# Patient Record
Sex: Female | Born: 1945
Health system: Southern US, Community
[De-identification: ages and names within clinical notes are randomized; demographics above are authoritative.]

## PROBLEM LIST (undated history)

## (undated) DIAGNOSIS — I10 Essential (primary) hypertension: Secondary | ICD-10-CM

## (undated) DIAGNOSIS — E7800 Pure hypercholesterolemia, unspecified: Secondary | ICD-10-CM

## (undated) DIAGNOSIS — E669 Obesity, unspecified: Secondary | ICD-10-CM

## (undated) DIAGNOSIS — E559 Vitamin D deficiency, unspecified: Secondary | ICD-10-CM

## (undated) DIAGNOSIS — R921 Mammographic calcification found on diagnostic imaging of breast: Secondary | ICD-10-CM

## (undated) DIAGNOSIS — Z860100 Personal history of colon polyps, unspecified: Secondary | ICD-10-CM

## (undated) DIAGNOSIS — G43909 Migraine, unspecified, not intractable, without status migrainosus: Secondary | ICD-10-CM

## (undated) DIAGNOSIS — A64 Unspecified sexually transmitted disease: Secondary | ICD-10-CM

## (undated) DIAGNOSIS — L280 Lichen simplex chronicus: Secondary | ICD-10-CM

## (undated) DIAGNOSIS — D219 Benign neoplasm of connective and other soft tissue, unspecified: Secondary | ICD-10-CM

## (undated) DIAGNOSIS — A630 Anogenital (venereal) warts: Secondary | ICD-10-CM

## (undated) DIAGNOSIS — D649 Anemia, unspecified: Secondary | ICD-10-CM

## (undated) HISTORY — DX: Anemia, unspecified: D64.9

## (undated) HISTORY — PX: MYOMECTOMY: SHX85

## (undated) HISTORY — DX: Obesity, unspecified: E66.9

## (undated) HISTORY — DX: Pure hypercholesterolemia, unspecified: E78.00

## (undated) HISTORY — DX: Anogenital (venereal) warts: A63.0

## (undated) HISTORY — PX: ABDOMINAL HYSTERECTOMY: SHX81

## (undated) HISTORY — DX: Mammographic calcification found on diagnostic imaging of breast: R92.1

## (undated) HISTORY — DX: Unspecified sexually transmitted disease: A64

## (undated) HISTORY — DX: Lichen simplex chronicus: L28.0

## (undated) HISTORY — DX: Migraine, unspecified, not intractable, without status migrainosus: G43.909

## (undated) HISTORY — DX: Personal history of colonic polyps: Z86.010

## (undated) HISTORY — DX: Personal history of colon polyps, unspecified: Z86.0100

## (undated) HISTORY — PX: BREAST SURGERY: SHX581

## (undated) HISTORY — DX: Essential (primary) hypertension: I10

## (undated) HISTORY — DX: Vitamin D deficiency, unspecified: E55.9

## (undated) HISTORY — PX: BREAST BIOPSY: SHX20

## (undated) HISTORY — DX: Benign neoplasm of connective and other soft tissue, unspecified: D21.9

---

## 1998-03-08 ENCOUNTER — Ambulatory Visit (HOSPITAL_COMMUNITY): Admission: RE | Admit: 1998-03-08 | Discharge: 1998-03-08 | Payer: Self-pay

## 1999-03-29 ENCOUNTER — Ambulatory Visit (HOSPITAL_COMMUNITY): Admission: RE | Admit: 1999-03-29 | Discharge: 1999-03-29 | Payer: Self-pay | Admitting: Obstetrics and Gynecology

## 2000-03-29 ENCOUNTER — Ambulatory Visit (HOSPITAL_COMMUNITY): Admission: RE | Admit: 2000-03-29 | Discharge: 2000-03-29 | Payer: Self-pay

## 2001-04-18 ENCOUNTER — Encounter: Payer: Self-pay | Admitting: Obstetrics and Gynecology

## 2001-04-18 ENCOUNTER — Ambulatory Visit (HOSPITAL_COMMUNITY): Admission: RE | Admit: 2001-04-18 | Discharge: 2001-04-18 | Payer: Self-pay | Admitting: Internal Medicine

## 2002-04-20 ENCOUNTER — Ambulatory Visit (HOSPITAL_COMMUNITY): Admission: RE | Admit: 2002-04-20 | Discharge: 2002-04-20 | Payer: Self-pay | Admitting: Obstetrics and Gynecology

## 2002-04-20 ENCOUNTER — Encounter: Payer: Self-pay | Admitting: Obstetrics and Gynecology

## 2002-10-30 ENCOUNTER — Encounter: Payer: Self-pay | Admitting: Obstetrics and Gynecology

## 2002-10-30 ENCOUNTER — Encounter: Admission: RE | Admit: 2002-10-30 | Discharge: 2002-10-30 | Payer: Self-pay | Admitting: Obstetrics and Gynecology

## 2003-11-10 ENCOUNTER — Encounter: Admission: RE | Admit: 2003-11-10 | Discharge: 2003-11-10 | Payer: Self-pay | Admitting: Obstetrics and Gynecology

## 2004-01-14 ENCOUNTER — Ambulatory Visit (HOSPITAL_COMMUNITY): Admission: RE | Admit: 2004-01-14 | Discharge: 2004-01-14 | Payer: Self-pay | Admitting: Gastroenterology

## 2004-01-14 ENCOUNTER — Encounter (INDEPENDENT_AMBULATORY_CARE_PROVIDER_SITE_OTHER): Payer: Self-pay | Admitting: Specialist

## 2004-11-14 ENCOUNTER — Ambulatory Visit (HOSPITAL_COMMUNITY): Admission: RE | Admit: 2004-11-14 | Discharge: 2004-11-14 | Payer: Self-pay | Admitting: Obstetrics and Gynecology

## 2005-11-16 ENCOUNTER — Ambulatory Visit (HOSPITAL_COMMUNITY): Admission: RE | Admit: 2005-11-16 | Discharge: 2005-11-16 | Payer: Self-pay | Admitting: Obstetrics and Gynecology

## 2006-12-09 ENCOUNTER — Ambulatory Visit (HOSPITAL_COMMUNITY): Admission: RE | Admit: 2006-12-09 | Discharge: 2006-12-09 | Payer: Self-pay | Admitting: Obstetrics and Gynecology

## 2006-12-17 ENCOUNTER — Ambulatory Visit (HOSPITAL_COMMUNITY): Admission: RE | Admit: 2006-12-17 | Discharge: 2006-12-17 | Payer: Self-pay | Admitting: Obstetrics & Gynecology

## 2007-11-29 DIAGNOSIS — L280 Lichen simplex chronicus: Secondary | ICD-10-CM

## 2007-11-29 HISTORY — DX: Lichen simplex chronicus: L28.0

## 2007-12-12 ENCOUNTER — Ambulatory Visit (HOSPITAL_COMMUNITY): Admission: RE | Admit: 2007-12-12 | Discharge: 2007-12-12 | Payer: Self-pay | Admitting: Obstetrics & Gynecology

## 2007-12-19 ENCOUNTER — Encounter: Admission: RE | Admit: 2007-12-19 | Discharge: 2007-12-19 | Payer: Self-pay | Admitting: Obstetrics & Gynecology

## 2007-12-19 ENCOUNTER — Encounter (INDEPENDENT_AMBULATORY_CARE_PROVIDER_SITE_OTHER): Payer: Self-pay | Admitting: Diagnostic Radiology

## 2008-05-31 ENCOUNTER — Encounter: Admission: RE | Admit: 2008-05-31 | Discharge: 2008-05-31 | Payer: Self-pay | Admitting: Obstetrics & Gynecology

## 2008-12-17 ENCOUNTER — Encounter: Admission: RE | Admit: 2008-12-17 | Discharge: 2008-12-17 | Payer: Self-pay | Admitting: Obstetrics & Gynecology

## 2009-12-21 ENCOUNTER — Encounter: Admission: RE | Admit: 2009-12-21 | Discharge: 2009-12-21 | Payer: Self-pay | Admitting: Obstetrics & Gynecology

## 2010-09-15 NOTE — Op Note (Signed)
Leslie Webb, Leslie Webb                          ACCOUNT NO.:  [id number]   MEDICAL RECORD NO.:  [id number]                   PATIENT TYPE:  AMB   LOCATION:  ENDO                                 FACILITY:  [facility]   PHYSICIAN:  Anselmo Rod, M.D.               DATE OF BIRTH:  1945/09/04   DATE OF PROCEDURE:  01/14/2004  DATE OF DISCHARGE:                                 OPERATIVE REPORT   PROCEDURE PERFORMED:  Colonoscopy with biopsies times two.   ENDOSCOPIST:  Charna Elizabeth, M.D.   INSTRUMENT USED:  Olympus video colonoscope.   INDICATIONS FOR PROCEDURE:  The patient is a 65 year old African-American  female undergoing screening colonoscopy.  The patient has a history of colon  cancer in both mother and father's side of the family.  Rule out colonic  polyps, masses, etc.   PREPROCEDURE PREPARATION:  Informed consent was procured from the patient.  The patient was fasted for eight hours prior to the procedure and prepped  with a bottle of magnesium citrate and a gallon of GoLYTELY the night prior  to the procedure.   PREPROCEDURE PHYSICAL:  The patient had stable vital signs.  Neck supple.  Chest clear to auscultation.  S1 and S2 regular.  Abdomen soft with normal  bowel sounds.   DESCRIPTION OF PROCEDURE:  The patient was placed in left lateral decubitus  position and sedated with 75 mg of Demerol and 6 mg of Versed in slow  incremental doses.  Once the patient was adequately sedated and maintained  on low flow oxygen and continuous cardiac monitoring, the Olympus video  colonoscope was advanced from the rectum to the cecum with difficulty  because of the patient's body habitus.  The patient's position was changed  from the left lateral to the supine position with gentle application of  abdominal pressure to reach the cecum.  The appendicular orifice and  ileocecal valve were clearly visualized and photographed.  A small sessile  polyp was biopsied from the cecum and a small  sessile polyp was biopsied  from the rectum.  Retroflexion in the rectum revealed no abnormalities.  A  few early scattered diverticula were seen throughout the colon.   IMPRESSION:  1.  Small sessile polyp biopsied from rectum and cecum.  2.  Early scattered diverticulosis throughout the colon.  3.  No large masses or polyps seen.   RECOMMENDATIONS:  1.  Await pathology results.  2.  Repeat colorectal cancer screening depending on pathology results.  3.  Outpatient followup in the next two weeks for further recommendations.      JNM/MEDQ  D:  01/14/2004  T:  01/16/2004  Job:  [id number]   cc:   Laqueta Linden, M.D.  [address]  [place]  [place] [address]  Fax: [phone]   Janae Bridgeman. Eloise Harman., M.D.  [address]  [place]  [place] [address]  Fax: [phone]

## 2010-11-14 ENCOUNTER — Other Ambulatory Visit: Payer: Self-pay | Admitting: Obstetrics & Gynecology

## 2010-11-14 DIAGNOSIS — Z1231 Encounter for screening mammogram for malignant neoplasm of breast: Secondary | ICD-10-CM

## 2010-12-25 ENCOUNTER — Ambulatory Visit (HOSPITAL_COMMUNITY)
Admission: RE | Admit: 2010-12-25 | Discharge: 2010-12-25 | Disposition: A | Discharge status: Home / Self Care | Payer: BC Managed Care – PPO | Source: Ambulatory Visit | Attending: Obstetrics & Gynecology | Admitting: Obstetrics & Gynecology

## 2010-12-25 DIAGNOSIS — Z1231 Encounter for screening mammogram for malignant neoplasm of breast: Secondary | ICD-10-CM

## 2011-03-07 LAB — HM PAP SMEAR: HM Pap smear: NEGATIVE

## 2011-12-05 ENCOUNTER — Other Ambulatory Visit: Payer: Self-pay | Admitting: Obstetrics & Gynecology

## 2011-12-05 DIAGNOSIS — Z1231 Encounter for screening mammogram for malignant neoplasm of breast: Secondary | ICD-10-CM

## 2011-12-27 ENCOUNTER — Ambulatory Visit (HOSPITAL_COMMUNITY)
Admission: RE | Admit: 2011-12-27 | Discharge: 2011-12-27 | Disposition: A | Discharge status: Home / Self Care | Payer: BC Managed Care – PPO | Source: Ambulatory Visit | Attending: Obstetrics & Gynecology | Admitting: Obstetrics & Gynecology

## 2011-12-27 DIAGNOSIS — Z1231 Encounter for screening mammogram for malignant neoplasm of breast: Secondary | ICD-10-CM | POA: Insufficient documentation

## 2012-12-04 ENCOUNTER — Other Ambulatory Visit: Payer: Self-pay | Admitting: Obstetrics & Gynecology

## 2012-12-04 DIAGNOSIS — Z1231 Encounter for screening mammogram for malignant neoplasm of breast: Secondary | ICD-10-CM

## 2013-01-20 ENCOUNTER — Other Ambulatory Visit: Payer: Self-pay | Admitting: Obstetrics & Gynecology

## 2013-01-20 ENCOUNTER — Ambulatory Visit (HOSPITAL_COMMUNITY)
Admission: RE | Admit: 2013-01-20 | Discharge: 2013-01-20 | Disposition: A | Discharge status: Home / Self Care | Payer: BC Managed Care – PPO | Source: Ambulatory Visit | Attending: Obstetrics & Gynecology | Admitting: Obstetrics & Gynecology

## 2013-01-20 DIAGNOSIS — Z1231 Encounter for screening mammogram for malignant neoplasm of breast: Secondary | ICD-10-CM | POA: Insufficient documentation

## 2013-04-03 ENCOUNTER — Ambulatory Visit (INDEPENDENT_AMBULATORY_CARE_PROVIDER_SITE_OTHER): Payer: BC Managed Care – PPO | Admitting: Obstetrics and Gynecology

## 2013-04-03 ENCOUNTER — Encounter: Payer: Self-pay | Admitting: Obstetrics and Gynecology

## 2013-04-03 VITALS — BP 146/90 | HR 64 | Ht 67.0 in | Wt 275.0 lb

## 2013-04-03 DIAGNOSIS — Z01419 Encounter for gynecological examination (general) (routine) without abnormal findings: Secondary | ICD-10-CM

## 2013-04-03 DIAGNOSIS — M25569 Pain in unspecified knee: Secondary | ICD-10-CM

## 2013-04-03 DIAGNOSIS — M25562 Pain in left knee: Secondary | ICD-10-CM

## 2013-04-03 DIAGNOSIS — R159 Full incontinence of feces: Secondary | ICD-10-CM

## 2013-04-03 NOTE — Patient Instructions (Signed)
EXERCISE AND DIET:  We recommended that you start or continue a regular exercise program for good health. Regular exercise means any activity that makes your heart beat faster and makes you sweat.  We recommend exercising at least 30 minutes per day at least 3 days a week, preferably 4 or 5.  We also recommend a diet low in fat and sugar.  Inactivity, poor dietary choices and obesity can cause diabetes, heart attack, stroke, and kidney damage, among others.    ALCOHOL AND SMOKING:  Women should limit their alcohol intake to no more than 7 drinks/beers/glasses of wine (combined, not each!) per week. Moderation of alcohol intake to this level decreases your risk of breast cancer and liver damage. And of course, no recreational drugs are part of a healthy lifestyle.  And absolutely no smoking or even second hand smoke. Most people know smoking can cause heart and lung diseases, but did you know it also contributes to weakening of your bones? Aging of your skin?  Yellowing of your teeth and nails?  CALCIUM AND VITAMIN D:  Adequate intake of calcium and Vitamin D are recommended.  The recommendations for exact amounts of these supplements seem to change often, but generally speaking 600 mg of calcium (either carbonate or citrate) and 800 units of Vitamin D per day seems prudent. Certain women may benefit from higher intake of Vitamin D.  If you are among these women, your doctor will have told you during your visit.    PAP SMEARS:  Pap smears, to check for cervical cancer or precancers,  have traditionally been done yearly, although recent scientific advances have shown that most women can have pap smears less often.  However, every woman still should have a physical exam from her gynecologist every year. It will include a breast check, inspection of the vulva and vagina to check for abnormal growths or skin changes, a visual exam of the cervix, and then an exam to evaluate the size and shape of the uterus and  ovaries.  And after 68 years of age, a rectal exam is indicated to check for rectal cancers. We will also provide age appropriate advice regarding health maintenance, like when you should have certain vaccines, screening for sexually transmitted diseases, bone density testing, colonoscopy, mammograms, etc.   MAMMOGRAMS:  All women over 40 years old should have a yearly mammogram. Many facilities now offer a "3D" mammogram, which may cost around $50 extra out of pocket. If possible,  we recommend you accept the option to have the 3D mammogram performed.  It both reduces the number of women who will be called back for extra views which then turn out to be normal, and it is better than the routine mammogram at detecting truly abnormal areas.    COLONOSCOPY:  Colonoscopy to screen for colon cancer is recommended for all women at age 50.  We know, you hate the idea of the prep.  We agree, BUT, having colon cancer and not knowing it is worse!!  Colon cancer so often starts as a polyp that can be seen and removed at colonscopy, which can quite literally save your life!  And if your first colonoscopy is normal and you have no family history of colon cancer, most women don't have to have it again for 10 years.  Once every ten years, you can do something that may end up saving your life, right?  We will be happy to help you get it scheduled when you are ready.    Be sure to check your insurance coverage so you understand how much it will cost.  It may be covered as a preventative service at no cost, but you should check your particular policy.      Fecal Incontinence Fecal incontinence is the inability to control your bowels. When you feel the urge to have a bowel movement, you may not be able to wait until you can get to a restroom. Stool may leak from the rectum unexpectedly. CAUSES   Constipation.  Damage to the nerves of the anal sphincter muscles or the rectum.  Diarrhea.  Damage to the anal sphincter  muscles.  Loss of storage capacity in the rectum.  Pelvic floor dysfunction. DIAGNOSIS  Your caregiver will ask questions, do a physical exam, and possibly order other tests. These tests may include:  Anal manometry. This checks the anal sphincter tightness and its ability to respond to signals, as well as the sensitivity and function of the rectum.  Anorectal ultrasonography. This test evaluates the structure of the anal sphincters.  Proctography (also called defecography). This test shows how much stool the rectum can hold, how well the rectum holds it, and how well the rectum can get rid of the stool.  Proctosigmoidoscopy. This test allows caregivers to look inside the rectum for signs of disease or other problems that could cause fecal incontinence, such as inflammation, tumors, or scar tissue.  Anal electromyography. This tests for nerve damage. TREATMENT  Treatment depends on the cause and severity of fecal incontinence. It may include dietary changes, medicine, bowel training, or surgery. More than one treatment may be necessary for successful control. Treatment may include:  Adjusting what and how you eat.  Medicine to help you develop a more regular bowel pattern. Medicine may also be prescribed for diarrhea.  Bowel training to help you learn how to control your bowels (biofeedback). In some cases, it involves strengthening muscles. In others, it means training the bowels to empty at a specific time of day.  Surgery to repair damaged areas or to replace the anal muscle.  A colostomy if other treatments fail. This involves removing a portion of the bowel. The remaining part is then attached to either the anus, or to a hole in the abdomen (stoma) through which stool leaves the body and is collected in a pouch. HOME CARE INSTRUCTIONS   Eat high fiber foods only if directed by your caregiver. Fiber adds bulk and makes stool easier to control. Oppositely, high fiber foods can act  as a laxative and can make the problem worse.  Keep a food diary. List what you eat, how much you eat, and when you have an incontinent episode. After a few days, you may begin to see a pattern involving certain foods and incontinence. After you identify foods that seem to cause problems, cut back on them and see whether incontinence improves.  Avoid the following foods and drinks that often cause diarrhea:  Caffeine.  Spicy foods.  Fatty and greasy foods.  Dairy products (milk, cheese, and ice cream).  Cured or smoked meat like sausage, ham, or [place].  Alcohol.  Fruits like apples, peaches, or pears.  Ask your doctor if you need a vitamin supplement.  Drink enough water and fluids to keep your urine clear or pale yellow.  Wear cotton underwear and loose clothes that "breathe." Tight clothes that block air can worsen anal problems. Change soiled underwear as soon as possible.  Wash the anal area with water, not soap,  after each bowel movement to help with anal discomfort. Use pre-moistened, alcohol-free wipes. Try using non-medicated talcum powder or corn starch to relieve anal discomfort.  Your caregiver may recommend an appropriate cream or ointment that can help prevent skin irritation from direct contact with stool.  Talk to your caregiver if you are having emotional distress. TIPS  Take a bag containing cleanup supplies and a change of clothing with you everywhere.  Locate public restrooms before you need them so you know where to go.  Use the toilet before heading out.  Wear disposable undergarments or sanitary pads if you think an episode is likely.  Use oral fecal deodorants to add to your comfort level if episodes are frequent. FOR MORE INFORMATION  American Academy of [employer]icians: www.https://powers.com/ [employer]astrointestinal [employer]: www.iffgd.org Document Released: 03/28/2004 Document Revised: 07/09/2011 Document Reviewed:  08/22/2009 [facility] Patient Information 2014 [place], [place].   Psyllium oral capsule What is this medicine? PSYLLIUM (SIL i yum) is a bulk-forming fiber laxative. This medicine is used to treat constipation. This medicine may be used for other purposes; ask your health care provider or pharmacist if you have questions. COMMON BRAND NAME(S): GenFiber , Konsyl, Metamucil MultiHealth, Metamucil, Reguloid What should I tell my health care provider before I take this medicine? They need to know if you have any of these conditions: -change in bowel habits for more than 14 days -blocked intestines or bowel -stomach pain, nausea, or vomiting -trouble swallowing -an unusual or allergic reaction to psyllium, tartrazine dye, other medicines, dyes, or preservatives -pregnant or trying or get pregnant -breast-feeding How should I use this medicine? Take this medicine by mouth with a full glass of water. Follow the directions on the package labeling, or take as directed by your health care professional. Take your medicine at regular intervals. Do not take your medicine more often than directed. Talk to your pediatrician regarding the use of this medicine in children. While this drug may be prescribed for children as young as 52 years old for selected conditions, precautions do apply. Overdosage: If you think you have taken too much of this medicine contact a poison control center or emergency room at once. NOTE: This medicine is only for you. Do not share this medicine with others. What if I miss a dose? If you miss a dose, take it as soon as you can. If it is almost time for your next dose, take only that dose. Do not take double or extra doses. What may interact with this medicine? Interactions are not expected. This list may not describe all possible interactions. Give your health care provider a list of all the medicines, herbs, non-prescription drugs, or dietary supplements you use. Also tell them  if you smoke, drink alcohol, or use illegal drugs. Some items may interact with your medicine. What should I watch for while using this medicine? This medicine can take up to 3 days to work. Check with your doctor or health care professional if your symptoms do not start to get better or if they get worse. See your doctor if you have to treat your constipation for more than 1 week. Avoid taking other medicines within 2 hours of taking this medicine. Drink several glasses of water a day while you are taking this medicine. This will help to relieve constipation and prevent dehydration. What side effects may I notice from receiving this medicine? Side effects that you should report to your doctor or health care professional as soon  as possible: -allergic reactions like skin rash, itching or hives, swelling of the face, lips, or tongue -breathing problems -chest pain -nausea, vomiting -rectal bleeding -trouble swallowing Side effects that usually do not require medical attention (report to your doctor or health care professional if they continue or are bothersome): -bloated or 'gassy' feeling -diarrhea -headache -stomach cramps This list may not describe all possible side effects. Call your doctor for medical advice about side effects. You may report side effects to FDA at 1-800-FDA-1088. Where should I keep my medicine? Keep out of the reach of children. Store at room temperature between 15 and 30 degrees C (59 and 86 degrees F). Protect from moisture. Throw away any unused medicine after the expiration date. NOTE: This sheet is a summary. It may not cover all possible information. If you have questions about this medicine, talk to your doctor, pharmacist, or health care provider.  2014, Elsevier/Gold Standard. (2007-11-21 11:26:07)

## 2013-04-03 NOTE — Progress Notes (Signed)
Patient ID: Leslie Webb, female   DOB: 1945/09/15, 66 y.o.   MRN: [id number] GYNECOLOGY VISIT  PCP:   Pearson Grippe, MD  Referring provider:   HPI: 67 y.o.   Single  African American  female   [id number] with No LMP recorded. Patient has had a hysterectomy.   here for   AEX. Patient having left knee pain started two days ago.  Has lichen sclerosis.  Notes a left mons nodule.  Not using clobetasol as the itching is not constant.   Some fecal soiling for several months.  Does continuous wiping of feces during the day.  Wearing pads all day long.  No new medications. Up to date on colonoscopy.    Hgb:    PCP Urine:  Neg   GYNECOLOGIC HISTORY: No LMP recorded. Patient has had a hysterectomy.  TAH/BSO.  History of fibroids.  Sexually active:  no Partner preference: female Contraception:   hysterectomy Menopausal hormone therapy: no DES exposure:   no Blood transfusions:  no  Sexually transmitted diseases:   Genital Warts (HPV) GYN Procedures:  Hysterectomy/BSO--age 66, Myomectomy age 64, right breast bx--benign. Mammogram:  01-21-13 wnl:[facility]               Pap:   03-07-11 wnl:neg HR HPV  History of abnormal pap smear:  Hx of cryotherapy to cervix 30 years ago with follow-up paps normal.   OB History   Grav Para Term Preterm Abortions TAB SAB Ect Mult Living   2 0 0 0 2 2    0       LIFESTYLE: Exercise:     no         Tobacco:     no Alcohol:       1 drink per week Drug use:    no  OTHER HEALTH MAINTENANCE: Tetanus/TDap:   2013 Gardisil:              n/a Influenza:            01/2013 Zostavax:             never  Bone density:     12-17-06 wnl Colonoscopy:     04/2009 revealed polyps:Dr. Loreta Ave.  Next colonoscopy due 2018  Cholesterol check:  01/2013 elevated  Family History  Problem Relation Age of Onset  . Breast cancer Sister 68    Stage III  . Diabetes Mother   . Hypertension Mother   . Prostate cancer Father     deceased age 42  . Hypertension  Maternal Grandmother   . Pancreatic cancer Paternal Grandmother   . Hypertension Brother     There are no active problems to display for this patient.  Past Medical History  Diagnosis Date  . Lichen simplex chronicus 11/2007    --vulva  . Obesity   . Migraine   . History of colon polyps   . Vitamin D deficiency   . Breast calcifications   . STD (sexually transmitted disease)     genital warts  . Anemia   . Genital warts   . Fibroid   . Hypertension     Past Surgical History  Procedure Laterality Date  . Breast surgery      Rt. breast Bx--benign  . Abdominal hysterectomy      TAH/BSO age 37  . Myomectomy      -age 51    ALLERGIES: Review of patient's allergies indicates no known allergies.  Current Outpatient Prescriptions  Medication Sig  Dispense Refill  . amLODipine (NORVASC) 2.5 MG tablet Take 2.5 mg by mouth daily.      . Cholecalciferol (VITAMIN D-3) 5000 UNITS TABS Take 1 tablet by mouth daily.      . furosemide (LASIX) 20 MG tablet Take 20 mg by mouth daily.      Marland Kitchen KLOR-CON M20 20 MEQ tablet Take 1 tablet by mouth daily.      . metoprolol succinate (TOPROL-XL) 50 MG 24 hr tablet Take 50 mg by mouth daily. Take with or immediately following a meal.      . Multiple Vitamin (MULTIVITAMIN) capsule Take 1 capsule by mouth daily.      . potassium chloride (K-DUR) 10 MEQ tablet Take 10 mEq by mouth daily.       No current facility-administered medications for this visit.     ROS:  Pertinent items are noted in HPI.  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   PHYSICAL EXAMINATION:    BP 146/90  Pulse 64  Ht 5\' 7"  (1.702 m)  Wt 275 lb (124.739 kg)  BMI 43.06 kg/m2   Wt Readings from Last 3 Encounters:  04/03/13 275 lb (124.739 kg)     Ht Readings from Last 3 Encounters:  04/03/13 5\' 7"  (1.702 m)    General appearance: alert, cooperative and appears stated age Head: Normocephalic, without obvious abnormality, atraumatic Neck: no  adenopathy, supple, symmetrical, trachea midline and thyroid not enlarged, symmetric, no tenderness/mass/nodules Lungs: clear to auscultation bilaterally Breasts: Inspection negative, No nipple retraction or dimpling, No nipple discharge or bleeding, No axillary or supraclavicular adenopathy, Normal to palpation without dominant masses Heart: regular rate and rhythm Abdomen: obese, soft, non-tender; no masses,  no organomegaly Extremities: extremities normal, atraumatic, no cyanosis.  Bilateral ankle edema.  Homan's negative bilaterally.  No palpable cords. Skin: Skin color, texture, turgor normal. No rashes or lesions Lymph nodes: Cervical, supraclavicular, and axillary nodes normal. No abnormal inguinal nodes palpated Neurologic: Grossly normal  Pelvic: External genitalia:  no lesions.  Lateral to left labia has 0.75 cm nontender skin cyst - probable sebaceous cyst.               Urethra:  normal appearing urethra with no masses, tenderness or lesions              Bartholins and Skenes: normal                 Vagina: normal appearing vagina with normal color and discharge, no lesions              Cervix:  absent              Pap and high risk HPV testing done: no.            Bimanual Exam:  Uterus:   absent                                      Adnexa: no masses                                      Rectovaginal: Confirms  Anus:  normal sphincter tone, no lesions  ASSESSMENT  Normal gynecologic exam. Left knee pain.  Fecal incontinence.  Sebaceous cyst of left vulva.  PLAN  Mammogram yearly Pap smear and high risk HPV testing not indicated.  Counseled on fecal incontinence. Metamucil. Increase fiber in diet. Kegel's. If fecal incontinence does not improve, will send to GI or colon and rectal surgery.  Patient will follow up with PCP or urgent care regarding her knee pain.  Return annually or prn   An After Visit Summary was printed and  given to the patient.

## 2013-04-09 ENCOUNTER — Ambulatory Visit
Admission: RE | Admit: 2013-04-09 | Discharge: 2013-04-09 | Disposition: A | Discharge status: Home / Self Care | Payer: BC Managed Care – PPO | Source: Ambulatory Visit | Attending: Internal Medicine | Admitting: Internal Medicine

## 2013-04-09 ENCOUNTER — Other Ambulatory Visit: Payer: Self-pay | Admitting: Internal Medicine

## 2013-04-09 DIAGNOSIS — M79605 Pain in left leg: Secondary | ICD-10-CM

## 2013-12-14 ENCOUNTER — Other Ambulatory Visit: Payer: Self-pay | Admitting: Obstetrics & Gynecology

## 2013-12-14 DIAGNOSIS — Z1231 Encounter for screening mammogram for malignant neoplasm of breast: Secondary | ICD-10-CM

## 2014-01-26 ENCOUNTER — Ambulatory Visit (HOSPITAL_COMMUNITY)
Admission: RE | Admit: 2014-01-26 | Discharge: 2014-01-26 | Disposition: A | Discharge status: Home / Self Care | Payer: BC Managed Care – PPO | Source: Ambulatory Visit | Attending: Obstetrics & Gynecology | Admitting: Obstetrics & Gynecology

## 2014-01-26 DIAGNOSIS — Z1231 Encounter for screening mammogram for malignant neoplasm of breast: Secondary | ICD-10-CM | POA: Diagnosis not present

## 2014-04-09 ENCOUNTER — Ambulatory Visit (INDEPENDENT_AMBULATORY_CARE_PROVIDER_SITE_OTHER): Payer: BC Managed Care – PPO | Admitting: Obstetrics and Gynecology

## 2014-04-09 ENCOUNTER — Encounter: Payer: Self-pay | Admitting: Obstetrics and Gynecology

## 2014-04-09 VITALS — BP 140/88 | HR 60 | Resp 22 | Ht 67.0 in | Wt 279.8 lb

## 2014-04-09 DIAGNOSIS — Z01419 Encounter for gynecological examination (general) (routine) without abnormal findings: Secondary | ICD-10-CM

## 2014-04-09 NOTE — Patient Instructions (Signed)

## 2014-04-09 NOTE — Progress Notes (Signed)
Patient ID: Leslie Webb, female   DOB: [birth year], 68 y.o.   MRN: [id number] 69 y.o. Leslie Webb here for annual exam.    Notes a sore lump under her right arm. Drained.  Notes lumps under her left arm as well.  Has bumps that get sore in the skin under her breasts as well.  Has this also in the groin area.   Having leakage of feces.  Metamucil is helpful.  Tried Metamucil with calcium works better.  Will call Dr. Collene Mares to schedule colonoscopy.   Retiring in May 2016.   Sees PCP regularly.  Has consultation scheduled.  Has some palpitations and leg swelling.  No chest pain or pressure.  PCP:  Leslie Gravel, MD  No LMP recorded. Patient has had a hysterectomy.          Sexually active: No.female  The current method of family planning is hysterectomy. TAH/BSO.  Exercising: No.  none. Smoker:  no  Health Maintenance: Pap:  03-07-11 wnl:neg HR HPV History of abnormal Pap:  Yes, Hx of cryotherapy to cervix over 30 years ago with follow up paps normal. MMG:  01-26-14 fatty breasts/nl:[facility] Colonoscopy:  04/2009 revealed polyps with Dr. Collene Mares.  Next colonoscopy due 2018. BMD:   12-17-06 wnl:[facility] TDaP:  2013 Screening Labs:  Hb today: PCP, Urine today: unable to void   reports that she has never smoked. She does not have any smokeless tobacco history on file. She reports that she drinks about 0.6 oz of alcohol per week. She reports that she does not use illicit drugs.  Past Medical History  Diagnosis Date  . Lichen simplex chronicus 11/2007    --vulva  . Obesity   . Migraine   . History of colon polyps   . Vitamin D deficiency   . Breast calcifications   . STD (sexually transmitted disease)     genital warts  . Anemia   . Genital warts   . Fibroid   . Hypertension     Past Surgical History  Procedure Laterality Date  . Breast surgery      Rt. breast Bx--benign  . Abdominal hysterectomy      TAH/BSO age 72  . Myomectomy     -age 35    Current Outpatient Prescriptions  Medication Sig Dispense Refill  . aspirin 81 MG tablet Take 81 mg by mouth daily.    . Biotin 5000 MCG CAPS Take 5,000 mcg by mouth daily.    Marland Kitchen amLODipine (NORVASC) 2.5 MG tablet Take 2.5 mg by mouth daily.    . Cholecalciferol (VITAMIN D-3) 5000 UNITS TABS Take 1 tablet by mouth daily.    . furosemide (LASIX) 20 MG tablet Take 20 mg by mouth daily.    Marland Kitchen KLOR-CON M20 20 MEQ tablet Take 1 tablet by mouth daily.    . metoprolol succinate (TOPROL-XL) 50 MG 24 hr tablet Take 50 mg by mouth daily. Take with or immediately following a meal.    . Multiple Vitamin (MULTIVITAMIN) capsule Take 1 capsule by mouth daily.    . potassium chloride (K-DUR) 10 MEQ tablet Take 10 mEq by mouth daily.     No current facility-administered medications for this visit.    Family History  Problem Relation Age of Onset  . Breast cancer Sister 53    Stage III  . Diabetes Mother   . Hypertension Mother   . Prostate cancer Father     deceased age 86  . Hypertension  Maternal Grandmother   . Pancreatic cancer Paternal Grandmother   . Hypertension Brother   . Cancer Brother     neck cancer    ROS:  Pertinent items are noted in HPI.  Otherwise, a comprehensive ROS was negative.  Exam:   BP 140/88 mmHg  Pulse 60  Resp 22  Ht 5\' 7"  (1.702 m)  Wt 279 lb 12.8 oz (126.916 kg)  BMI 43.81 kg/m2      Height: 5\' 7"  (170.2 cm)  Ht Readings from Last 3 Encounters:  04/09/14 5\' 7"  (1.702 m)  04/03/13 5\' 7"  (1.702 m)    General appearance: alert, cooperative and appears stated age Head: Normocephalic, without obvious abnormality, atraumatic Neck: no adenopathy, supple, symmetrical, trachea midline and thyroid normal to inspection and palpation Lungs: clear to auscultation bilaterally Breasts: normal appearance, no masses or tenderness, Inspection negative, No nipple retraction or dimpling, No nipple discharge or bleeding, No axillary or supraclavicular adenopathy.   Hair follicles with induration under right and left arms. Heart: regular rate and rhythm Abdomen: soft, non-tender; bowel sounds normal; no masses,  no organomegaly Extremities: extremities normal, atraumatic, no cyanosis or edema Skin: Skin color, texture, turgor normal. No rashes or lesions Lymph nodes: Cervical, supraclavicular, and axillary nodes normal. No abnormal inguinal nodes palpated Neurologic: Grossly normal   Pelvic: External genitalia:  Vulvar with three indurated hair follicles.               Urethra:  normal appearing urethra with no masses, tenderness or lesions              Bartholins and Skenes: normal                 Vagina: normal appearing vagina with normal color and discharge, no lesions              Cervix: absent              Pap taken: Yes.   Bimanual Exam:  Uterus:  uterus absent              Adnexa: normal adnexa and no mass, fullness, tenderness               Rectovaginal: Confirms               Anus:  normal sphincter tone, no lesions  A:  Well Woman with normal exam Status post TAH/BSO.  Remote history of cryotherapy to cervix.  Possible mild hydradenitis. Fecal incontinence.   P:   Mammogram yearly.  pap smear and HR HPV taken.  If this pap is normal, OK to stop pap smears.  Warm soaks to hair follicles if irritated. No abx needed.  Patient will contact Dr. Lorie Apley office to schedule her colonoscopy.  return annually or prn  An After Visit Summary was printed and given to the patient.

## 2014-04-13 LAB — IPS PAP TEST WITH HPV

## 2014-04-19 ENCOUNTER — Telehealth: Payer: Self-pay

## 2014-04-19 DIAGNOSIS — IMO0002 Reserved for concepts with insufficient information to code with codable children: Secondary | ICD-10-CM

## 2014-04-19 NOTE — Telephone Encounter (Signed)
Called patient to discuss pap results and schedule colposcopy at [phone], LMOVM to call me back.

## 2014-04-19 NOTE — Telephone Encounter (Signed)
-----   Message from [place], MD sent at 04/14/2014  2:03 PM EST ----- Please inform patient of pap showing LGSIL and positive HR HPV.  Needs colposcopy with me.  History of cryotherapy years ago.

## 2014-04-20 NOTE — Telephone Encounter (Signed)
2016 benefit confirmed: PR: $30

## 2014-04-20 NOTE — Telephone Encounter (Signed)
Patient notified of message from Dr. Quincy Simmonds.  She is agreeable to scheduling colposcopy. Brief description of procedure given to patient.  Colposcopy pre-procedure instructions given. Advised 400 mg of Motrin with food one hour prior to appointment.  Patient unsure about taking motrin with hx of HBP. Patient denies CKD. Advised can take one dose of 400 mg and can review with Dr. Quincy Simmonds at time of visit and if needs additional we have motrin in office. Patient advised to  make sure to eat a meal before appointment and drink plenty of fluids. Also,  Advised will need to cancel within 24 hours or will have $100.00 no show fee placed to account.  Patient verbalized understanding of instructions and policies.   Type of birth control TAH/BSO   Notified of cancellation policy-Yes Scheduled procedure room-Yes  Order placed for  insurance pre certification.   [place]. Routing to provider for final review. Patient agreeable to disposition. Will close encounter

## 2014-05-06 ENCOUNTER — Ambulatory Visit (INDEPENDENT_AMBULATORY_CARE_PROVIDER_SITE_OTHER): Payer: BLUE CROSS/BLUE SHIELD | Admitting: Obstetrics and Gynecology

## 2014-05-06 DIAGNOSIS — IMO0002 Reserved for concepts with insufficient information to code with codable children: Secondary | ICD-10-CM

## 2014-05-06 DIAGNOSIS — R896 Abnormal cytological findings in specimens from other organs, systems and tissues: Secondary | ICD-10-CM

## 2014-05-06 NOTE — Patient Instructions (Signed)

## 2014-05-06 NOTE — Progress Notes (Signed)
Subjective:     Patient ID: Leslie Webb, female   DOB: 06-03-1945, 69 y.o.   MRN: [id number]  HPI  Patient had pap 04/09/14 showing LGSIL and positive HR HPV. Status post TAH/BSO age 58.  Remote history of cryotherapy to the cervix.   Review of Systems     Objective:   Physical Exam  Genitourinary:        Procedure   Consent for procedure.  Speculum placed in vagina. 3% acetic acid placed.  Mild acetowhite change noted along posterior vaginal cuff in midline, area approx 5 - 7 mm. Sent to pathology.  Biopsy taken.  Monsel's placed.  Minimal EBL.   Acetic acid soaked gauze to the vulva.  Normal colposcopic exam.   No complications.     Assessment:     LGSIL pap and positive HR HPV.     Plan:     Follow up biopsy result. Instructions and precautions given.  Anticipate next pap and HR HPV testing in one year.     After visit summary to patient.

## 2014-05-08 ENCOUNTER — Encounter: Payer: Self-pay | Admitting: Obstetrics and Gynecology

## 2014-05-11 LAB — IPS OTHER TISSUE BIOPSY

## 2014-05-12 ENCOUNTER — Telehealth: Payer: Self-pay

## 2014-05-12 NOTE — Telephone Encounter (Signed)
-----   Message from [place], MD sent at 05/11/2014  6:14 PM EST ----- Please report normal vaginal cuff colposcopy biopsy results to the patient.  Please enter recall - 08 for her next pap.  Cc- Marisa Sprinkles

## 2014-05-12 NOTE — Telephone Encounter (Signed)
Left message to call Attilio Zeitler at [phone]. 

## 2014-05-13 NOTE — Telephone Encounter (Signed)
Spoke with patient. Advised patient of message as seen below from [place]. Patient is agreeable and verbalizes understanding. 08 recall entered.   Routing to provider for final review. Patient agreeable to disposition. Will close encounter

## 2014-12-28 ENCOUNTER — Telehealth: Payer: Self-pay | Admitting: Obstetrics & Gynecology

## 2014-12-28 ENCOUNTER — Encounter: Payer: Self-pay | Admitting: Nurse Practitioner

## 2014-12-28 ENCOUNTER — Ambulatory Visit (INDEPENDENT_AMBULATORY_CARE_PROVIDER_SITE_OTHER): Payer: BLUE CROSS/BLUE SHIELD | Admitting: Nurse Practitioner

## 2014-12-28 ENCOUNTER — Telehealth: Payer: Self-pay

## 2014-12-28 ENCOUNTER — Other Ambulatory Visit: Payer: Self-pay

## 2014-12-28 VITALS — BP 126/84 | HR 76 | Ht 67.0 in | Wt 285.0 lb

## 2014-12-28 DIAGNOSIS — Z9289 Personal history of other medical treatment: Secondary | ICD-10-CM

## 2014-12-28 DIAGNOSIS — L720 Epidermal cyst: Secondary | ICD-10-CM | POA: Diagnosis not present

## 2014-12-28 DIAGNOSIS — R2232 Localized swelling, mass and lump, left upper limb: Secondary | ICD-10-CM

## 2014-12-28 NOTE — Progress Notes (Signed)
Subjective:     Patient ID: Leslie Webb, female   DOB: [birth year], 69 y.o.   MRN: [id number]  HPI  This 69 yo noted an area at the left axilla for about 7 days ago.  She has had multiple episodes of sebaceous cyst both at axilla and groin.  She then called to get mammogram scheduled and when asked if any concerns she mentioned this.  They immediately told her she needed to be checked and may need a diagnostic mammogram.    The current area of concern is maybe a little sore since she has been checking but there is no exudate.   She has not noted any breast mass or nipple discharge.    Review of Systems  Constitutional: Negative.   HENT: Negative.   Respiratory: Negative.   Cardiovascular: Negative.   Gastrointestinal: Negative.   Genitourinary: Negative.   Musculoskeletal: Negative.   Skin: Positive for color change and wound.  Neurological: Negative.   Psychiatric/Behavioral: Negative.        Objective:   Physical Exam  Constitutional: She is oriented to person, place, and time. She appears well-developed and well-nourished. No distress.  Cardiovascular: Normal rate.   Pulmonary/Chest: Effort normal.  Abdominal: Soft.  Genitourinary:  Breast exam bilaterally without mass or nipple discharge. No lymphadenopathy.  Neurological: She is alert and oriented to person, place, and time.  Skin: Skin is warm and dry.  Right axilla with 4 cystic areas. 1 blackhead was removed without problems. The area that looks bruised is at the 11:00 position of the axilla and has no exudate. It measures about 3 mm. Another area that is not tender and no exudate at 5:00 position.  States she has several in the groin right now but declined ot be checked. States none of them is sore or has any exudate.  Psychiatric: She has a normal mood and affect. Her behavior is normal. Judgment and thought content normal.       Assessment:     Epidermal cyst left axilla without abscess or exudate    Plan:      Warm compresses to area and if gets larger or needs I & D to call back Area is not irritated or large enough to require antibiotics at this time. She will call and get mammogram screening scheduled for after 01/27/15

## 2014-12-28 NOTE — Telephone Encounter (Signed)
Spoke with patient while in office today. Patient states that she found a lump under her left axilla last week. Patient called to schedule her mammogram and was advised she would need an order for a diagnostic mammogram and ultrasound. Advised will need to be seen in the office for a breast check. Orders for imaging will be placed at that time and she will be scheduled for the imaging if needed. Patient is agreeable. Appointment scheduled for today at 12:45 pm with Leslie Cage, FNP. Patient is agreeable to date and time.  Routing to provider for final review. Patient agreeable to disposition. Will close encounter.

## 2014-12-28 NOTE — Telephone Encounter (Signed)
Order is placed.

## 2014-12-28 NOTE — Telephone Encounter (Signed)
Spoke with patient at time of incoming call. Patient states that [facility] and was told that she will need an order for a standard screening mammogram from Milford Cage, [place]. "They say that they need something that says that I do not need a diagnostic mammogram." Advised I will speak with Milford Cage, FNP regarding order and return call once this has been placed so that she may schedule. Patient is agreeable.  Milford Cage, FNP please place order for mammogram for patient to the [place].

## 2014-12-28 NOTE — Telephone Encounter (Signed)
Patient arrived to office with a left breast lump asking for help scheduling her MMG.

## 2014-12-28 NOTE — Patient Instructions (Signed)
Epidermal Cyst An epidermal cyst is sometimes called a sebaceous cyst, epidermal inclusion cyst, or infundibular cyst. These cysts usually contain a substance that looks "pasty" or "cheesy" and may have a bad smell. This substance is a protein called keratin. Epidermal cysts are usually found on the face, neck, or trunk. They may also occur in the vaginal area or other parts of the genitalia of both men and women. Epidermal cysts are usually small, painless, slow-growing bumps or lumps that move freely under the skin. It is important not to try to pop them. This may cause an infection and lead to tenderness and swelling. CAUSES  Epidermal cysts may be caused by a deep penetrating injury to the skin or a plugged hair follicle, often associated with acne. SYMPTOMS  Epidermal cysts can become inflamed and cause:  Redness.  Tenderness.  Increased temperature of the skin over the bumps or lumps.  Grayish-white, bad smelling material that drains from the bump or lump. DIAGNOSIS  Epidermal cysts are easily diagnosed by your caregiver during an exam. Rarely, a tissue sample (biopsy) may be taken to rule out other conditions that may resemble epidermal cysts. TREATMENT   Epidermal cysts often get better and disappear on their own. They are rarely ever cancerous.  If a cyst becomes infected, it may become inflamed and tender. This may require opening and draining the cyst. Treatment with antibiotics may be necessary. When the infection is gone, the cyst may be removed with minor surgery.  Small, inflamed cysts can often be treated with antibiotics or by injecting steroid medicines.  Sometimes, epidermal cysts become large and bothersome. If this happens, surgical removal in your caregiver's office may be necessary. HOME CARE INSTRUCTIONS  Only take over-the-counter or prescription medicines as directed by your caregiver.  Take your antibiotics as directed. Finish them even if you start to feel  better. SEEK MEDICAL CARE IF:   Your cyst becomes tender, red, or swollen.  Your condition is not improving or is getting worse.  You have any other questions or concerns. MAKE SURE YOU:  Understand these instructions.  Will watch your condition.  Will get help right away if you are not doing well or get worse. Document Released: 03/17/2004 Document Revised: 07/09/2011 Document Reviewed: 10/23/2010 ExitCare Patient Information 2015 [employer]. This information is not intended to replace advice given to you by your health care provider. Make sure you discuss any questions you have with your health care provider.  

## 2014-12-29 NOTE — Progress Notes (Signed)
Encounter reviewed by Dr. Brook Amundson C. Silva.  

## 2014-12-29 NOTE — Telephone Encounter (Signed)
Spoke with patient. Advised order has been placed. Patient is agreeable and will call the [facility] to schedule her appointment.  Routing to provider for final review. Patient agreeable to disposition. Will close encounter.

## 2015-01-28 ENCOUNTER — Ambulatory Visit (HOSPITAL_COMMUNITY)
Admission: RE | Admit: 2015-01-28 | Discharge: 2015-01-28 | Disposition: A | Discharge status: Home / Self Care | Payer: BLUE CROSS/BLUE SHIELD | Source: Ambulatory Visit | Attending: Nurse Practitioner | Admitting: Nurse Practitioner

## 2015-01-28 DIAGNOSIS — Z9289 Personal history of other medical treatment: Secondary | ICD-10-CM

## 2015-01-28 DIAGNOSIS — Z1231 Encounter for screening mammogram for malignant neoplasm of breast: Secondary | ICD-10-CM | POA: Insufficient documentation

## 2015-02-10 ENCOUNTER — Ambulatory Visit (INDEPENDENT_AMBULATORY_CARE_PROVIDER_SITE_OTHER): Payer: BLUE CROSS/BLUE SHIELD | Admitting: Obstetrics and Gynecology

## 2015-02-10 ENCOUNTER — Encounter: Payer: Self-pay | Admitting: Obstetrics and Gynecology

## 2015-02-10 ENCOUNTER — Telehealth: Payer: Self-pay | Admitting: Obstetrics and Gynecology

## 2015-02-10 VITALS — BP 140/82 | HR 80 | Temp 98.2°F | Ht 67.0 in | Wt 280.0 lb

## 2015-02-10 DIAGNOSIS — R300 Dysuria: Secondary | ICD-10-CM | POA: Diagnosis not present

## 2015-02-10 DIAGNOSIS — R319 Hematuria, unspecified: Secondary | ICD-10-CM

## 2015-02-10 DIAGNOSIS — N3090 Cystitis, unspecified without hematuria: Secondary | ICD-10-CM

## 2015-02-10 LAB — POCT URINALYSIS DIPSTICK
Bilirubin, UA: NEGATIVE
Glucose, UA: NEGATIVE
KETONES UA: NEGATIVE
NITRITE UA: NEGATIVE
PH UA: 5
UROBILINOGEN UA: NEGATIVE

## 2015-02-10 MED ORDER — PHENAZOPYRIDINE HCL 200 MG PO TABS: 200.0000 mg | ORAL_TABLET | Freq: Three times a day (TID) | ORAL | Status: DC | PRN

## 2015-02-10 MED ORDER — SULFAMETHOXAZOLE-TRIMETHOPRIM 800-160 MG PO TABS: 1.0000 | ORAL_TABLET | Freq: Two times a day (BID) | ORAL | Status: DC

## 2015-02-10 NOTE — Telephone Encounter (Signed)
Patient walked in office to be seen because she is experiencing pain with urination and blood. Chart to triage.

## 2015-02-10 NOTE — Patient Instructions (Signed)
Urinary Tract Infection Urinary tract infections (UTIs) can develop anywhere along your urinary tract. Your urinary tract is your body's drainage system for removing wastes and extra water. Your urinary tract includes two kidneys, two ureters, a bladder, and a urethra. Your kidneys are a pair of bean-shaped organs. Each kidney is about the size of your fist. They are located below your ribs, one on each side of your spine. CAUSES Infections are caused by microbes, which are microscopic organisms, including fungi, viruses, and bacteria. These organisms are so small that they can only be seen through a microscope. Bacteria are the microbes that most commonly cause UTIs. SYMPTOMS  Symptoms of UTIs may vary by age and gender of the patient and by the location of the infection. Symptoms in young women typically include a frequent and intense urge to urinate and a painful, burning feeling in the bladder or urethra during urination. Older women and men are more likely to be tired, shaky, and weak and have muscle aches and abdominal pain. A fever may mean the infection is in your kidneys. Other symptoms of a kidney infection include pain in your back or sides below the ribs, nausea, and vomiting. DIAGNOSIS To diagnose a UTI, your caregiver will ask you about your symptoms. Your caregiver will also ask you to provide a urine sample. The urine sample will be tested for bacteria and white blood cells. White blood cells are made by your body to help fight infection. TREATMENT  Typically, UTIs can be treated with medication. Because most UTIs are caused by a bacterial infection, they usually can be treated with the use of antibiotics. The choice of antibiotic and length of treatment depend on your symptoms and the type of bacteria causing your infection. HOME CARE INSTRUCTIONS  If you were prescribed antibiotics, take them exactly as your caregiver instructs you. Finish the medication even if you feel better after  you have only taken some of the medication.  Drink enough water and fluids to keep your urine clear or pale yellow.  Avoid caffeine, tea, and carbonated beverages. They tend to irritate your bladder.  Empty your bladder often. Avoid holding urine for long periods of time.  Empty your bladder before and after sexual intercourse.  After a bowel movement, women should cleanse from front to back. Use each tissue only once. SEEK MEDICAL CARE IF:   You have back pain.  You develop a fever.  Your symptoms do not begin to resolve within 3 days. SEEK IMMEDIATE MEDICAL CARE IF:   You have severe back pain or lower abdominal pain.  You develop chills.  You have nausea or vomiting.  You have continued burning or discomfort with urination. MAKE SURE YOU:   Understand these instructions.  Will watch your condition.  Will get help right away if you are not doing well or get worse.   This information is not intended to replace advice given to you by your health care provider. Make sure you discuss any questions you have with your health care provider.   Document Released: 01/24/2005 Document Revised: 01/05/2015 Document Reviewed: 05/25/2011 Elsevier Interactive Patient Education 2016 [employer].   Call in 48 hours if your symptoms haven't resolved Call with fever, flank pain, or any other concerns

## 2015-02-10 NOTE — Telephone Encounter (Signed)
Scheduled wotk-in appointment with Dr Talbert Nan for 1230.   Routing to provider for final review. Patient agreeable to disposition. Will close encounter.

## 2015-02-10 NOTE — Progress Notes (Addendum)
Patient ID: Leslie Webb, female   DOB: 1946/02/03, 69 y.o.   MRN: [id number] GYNECOLOGY  VISIT   HPI: 69 y.o.   Single  African American  female   [id number] with No LMP recorded. Patient has had a hysterectomy.   here for UTI, states having painful urination and blood in urine. Dysuria started yesterday afternoon, very uncomfortable. Hematuria started today. C/O urinary frequency and urgency. No fevers,  GYNECOLOGIC HISTORY: No LMP recorded. Patient has had a hysterectomy. Contraception: Hysterectomy Menopausal hormone therapy: none        OB History    Gravida Para Term Preterm AB TAB SAB Ectopic Multiple Living   2 0 0 0 2 2    0         There are no active problems to display for this patient.   Past Medical History  Diagnosis Date  . Lichen simplex chronicus 11/2007    --vulva  . Obesity   . Migraine   . History of colon polyps   . Vitamin D deficiency   . Breast calcifications   . STD (sexually transmitted disease)     genital warts  . Anemia   . Genital warts   . Fibroid   . Hypertension     Past Surgical History  Procedure Laterality Date  . Breast surgery      Rt. breast Bx--benign  . Abdominal hysterectomy      TAH/BSO age 59  . Myomectomy      -age 54    Current Outpatient Prescriptions  Medication Sig Dispense Refill  . amLODipine (NORVASC) 2.5 MG tablet Take 2.5 mg by mouth daily.    Marland Kitchen aspirin 81 MG tablet Take 81 mg by mouth daily.    . Cholecalciferol (VITAMIN D-3) 5000 UNITS TABS Take 1 tablet by mouth daily.    . furosemide (LASIX) 20 MG tablet Take 20 mg by mouth daily.    Marland Kitchen KLOR-CON M20 20 MEQ tablet Take 1 tablet by mouth daily.    . metoprolol succinate (TOPROL-XL) 50 MG 24 hr tablet Take 50 mg by mouth daily. Take with or immediately following a meal.    . Multiple Vitamin (MULTIVITAMIN) capsule Take 1 capsule by mouth daily.    . Omega-3 Fatty Acids (OMEGA-3 FISH OIL PO) Take by mouth daily.     No current facility-administered  medications for this visit.     ALLERGIES: Review of patient's allergies indicates no known allergies.  Family History  Problem Relation Age of Onset  . Breast cancer Sister 10    Stage III  . Diabetes Mother   . Hypertension Mother   . Prostate cancer Father     deceased age 66  . Hypertension Maternal Grandmother   . Pancreatic cancer Paternal Grandmother   . Hypertension Brother   . Cancer Brother     neck cancer    Social History   Social History  . Marital Status: Single    Spouse Name: N/A  . Number of Children: N/A  . Years of Education: N/A   Occupational History  . Not on file.   Social History Main Topics  . Smoking status: Never Smoker   . Smokeless tobacco: Not on file  . Alcohol Use: 0.6 oz/week    1 Standard drinks or equivalent per week  . Drug Use: No  . Sexual Activity: No     Comment: TAH/BSO   Other Topics Concern  . Not on file   Social History Narrative  ROS  PHYSICAL EXAMINATION:    BP 140/82 mmHg  Pulse 80  Temp(Src) 98.2 F (36.8 C) (Oral)  Ht 5\' 7"  (1.702 m)  Wt 280 lb (127.007 kg)  BMI 43.84 kg/m2    General appearance: alert, cooperative and appears stated age Abd: soft, not tender, no masses CVA: not tender  ASSESSMENT Dysuria, urgency, frequency and hematuria, cw Cytitis    PLAN Send urine for ua, c&s Bactrim DS Pyridium for pain x 48 hours If her symptoms haven't resolved in 48 hours she needs to call the on call MD Call with Fevers, flank pain, or any other concerns   An After Visit Summary was printed and given to the patient.     Addendum: a comprehensive ROS was only + for urinary c/o and diarrhea.  Of note the patient reports that she recently was having palpitations and is being evaluated for that.

## 2015-02-11 LAB — URINALYSIS, MICROSCOPIC ONLY
Casts: NONE SEEN [LPF]
Crystals: NONE SEEN [HPF]
Squamous Epithelial / LPF: NONE SEEN [HPF] (ref <=5)
Yeast: NONE SEEN [HPF]

## 2015-02-12 LAB — URINE CULTURE: Colony Count: >=100000

## 2015-04-13 ENCOUNTER — Ambulatory Visit: Payer: BC Managed Care – PPO | Admitting: Obstetrics and Gynecology

## 2015-04-19 ENCOUNTER — Ambulatory Visit (INDEPENDENT_AMBULATORY_CARE_PROVIDER_SITE_OTHER): Payer: BLUE CROSS/BLUE SHIELD | Admitting: Obstetrics and Gynecology

## 2015-04-19 ENCOUNTER — Encounter: Payer: Self-pay | Admitting: Obstetrics and Gynecology

## 2015-04-19 VITALS — BP 128/80 | HR 64 | Resp 16 | Ht 68.0 in | Wt 282.6 lb

## 2015-04-19 DIAGNOSIS — R159 Full incontinence of feces: Secondary | ICD-10-CM | POA: Diagnosis not present

## 2015-04-19 DIAGNOSIS — N890 Mild vaginal dysplasia: Secondary | ICD-10-CM | POA: Diagnosis not present

## 2015-04-19 DIAGNOSIS — Z01419 Encounter for gynecological examination (general) (routine) without abnormal findings: Secondary | ICD-10-CM

## 2015-04-19 NOTE — Patient Instructions (Signed)

## 2015-04-19 NOTE — Progress Notes (Signed)
Patient ID: Leslie Webb, female   DOB: [birth year], 69 y.o.   MRN: [id number] 69 y.o. G2P0020 Single African American female here for annual exam.    Not having any problems.   Had a UTI this year.  Not recurrent problem. Still having fecal incontinence.  Occurs once per week.  Stopped Metamucil but this did helped the fecal incontinence.   Sees. Dr.Gannji - palpitation.   Retiring this year.  Sister retiring as well.   PCP: Jani Gravel, MD   No LMP recorded. Patient has had a hysterectomy.          Sexually active: No. female The current method of family planning is status post hysterectomy.    Exercising: No.   Smoker:  no  Health Maintenance: Pap:  04-09-14 LGSIL:Pos HR HPV History of abnormal Pap:  Yes, hx cryotherapy to cervix 1985. Pap 04-09-14 LGSIL:Pos HR HPV;colposcopy of vaginal cuff biopsy normal. MMG:  02-01-15 density cat.B/Neg:BiRads1:[facility]. Colonoscopy:  09/2014 diverticulosis with Dr. Lenise Herald due 04/2021. BMD:   12-17-06  Result  Normal at [facility]:  2013 Screening Labs:  Hb today: PCP, Urine today: unable to void   reports that she has never smoked. She does not have any smokeless tobacco history on file. She reports that she drinks about 0.6 oz of alcohol per week. She reports that she does not use illicit drugs.  Past Medical History  Diagnosis Date  . Lichen simplex chronicus 11/2007    --vulva  . Obesity   . Migraine   . History of colon polyps   . Vitamin D deficiency   . Breast calcifications   . STD (sexually transmitted disease)     genital warts  . Anemia   . Genital warts   . Fibroid   . Hypertension     Past Surgical History  Procedure Laterality Date  . Breast surgery      Rt. breast Bx--benign  . Abdominal hysterectomy      TAH/BSO age 59  . Myomectomy      -age 27    Current Outpatient Prescriptions  Medication Sig Dispense Refill  . amLODipine (NORVASC) 2.5 MG tablet Take 2.5 mg by mouth daily.    Marland Kitchen  aspirin 81 MG tablet Take 81 mg by mouth daily.    . Cholecalciferol (VITAMIN D-3) 5000 UNITS TABS Take 1 tablet by mouth daily.    . furosemide (LASIX) 20 MG tablet Take 20 mg by mouth daily.    Marland Kitchen KLOR-CON M20 20 MEQ tablet Take 1 tablet by mouth daily.    . metoprolol succinate (TOPROL-XL) 50 MG 24 hr tablet Take 50 mg by mouth daily. Take with or immediately following a meal.     No current facility-administered medications for this visit.    Family History  Problem Relation Age of Onset  . Breast cancer Sister 73    Stage III  . Diabetes Mother   . Hypertension Mother   . Prostate cancer Father     deceased age 87  . Hypertension Maternal Grandmother   . Pancreatic cancer Paternal Grandmother   . Hypertension Brother   . Cancer Brother     neck cancer    ROS:  Pertinent items are noted in HPI.  Otherwise, a comprehensive ROS was negative.  Exam:   BP 128/80 mmHg  Pulse 64  Resp 16  Ht 5\' 8"  (1.727 m)  Wt 282 lb 9.6 oz (128.187 kg)  BMI 42.98 kg/m2  General appearance: alert, cooperative and appears stated age Head: Normocephalic, without obvious abnormality, atraumatic Neck: no adenopathy, supple, symmetrical, trachea midline and thyroid normal to inspection and palpation Lungs: clear to auscultation bilaterally Breasts: normal appearance, no masses or tenderness, Inspection negative, No nipple retraction or dimpling, No nipple discharge or bleeding, No axillary or supraclavicular adenopathy Heart: regular rate and rhythm Abdomen: soft, non-tender; bowel sounds normal; no masses,  no organomegaly Extremities: extremities normal, atraumatic, no cyanosis or edema Skin: Skin color, texture, turgor normal. No rashes or lesions Lymph nodes: Cervical, supraclavicular, and axillary nodes normal. No abnormal inguinal nodes palpated Neurologic: Grossly normal  Pelvic: External genitalia:  no lesions              Urethra:  normal appearing urethra with no masses, tenderness  or lesions              Bartholins and Skenes: normal                 Vagina: normal appearing vagina with normal color and discharge, no lesions              Cervix: absent              Pap taken: Yes.   Bimanual Exam:  Uterus:  uterus absent              Adnexa: no mass, fullness, tenderness              Rectovaginal: Yes.  .  Confirms.              Anus:  normal sphincter tone, no lesions  Chaperone was present for exam.  Assessment:   Well woman visit with normal exam. Status post TAH/BSO.  Hx LGSIL of vaginal cuff.  Lichen simplex chronicus.  FH of breast cancer.  Fecal incontinence.  Discussed this as a risk factor for UTIs.  Plan: Yearly mammogram recommended after age 66.  Recommended self breast exam.  Pap and HR HPV as above. Discussed Calcium, Vitamin D, regular exercise program including cardiovascular and weight bearing exercise. Labs performed.  No..     Refills given on medications.  No..    She will restart Metamucil.   Follow up annually and prn.      After visit summary provided.

## 2015-04-22 LAB — IPS PAP TEST WITH HPV

## 2015-04-28 ENCOUNTER — Telehealth: Payer: Self-pay | Admitting: Emergency Medicine

## 2015-04-28 DIAGNOSIS — R87622 Low grade squamous intraepithelial lesion on cytologic smear of vagina (LGSIL): Secondary | ICD-10-CM

## 2015-04-28 NOTE — Telephone Encounter (Signed)
-----   Message from Salvadore Dom, MD sent at 04/25/2015  9:17 AM EST ----- Please inform the patient that her pap returned with LSIL with +HPV, she has a h/o a hysterectomy and VAIN. Please set her up for a colposcopy with Dr Quincy Simmonds.

## 2015-04-28 NOTE — Telephone Encounter (Signed)
Message left to return call to Modest Draeger at [phone].    

## 2015-04-29 NOTE — Telephone Encounter (Signed)
Patient returning call.

## 2015-04-29 NOTE — Telephone Encounter (Signed)
Patient notified of message from Dr. Talbert Nan for Dr. Quincy Simmonds.   She is agreeable to scheduling colposcopy. Scheduled colposcopy for 05/11/15 at 1000.  Brief description of procedure given to patient.  Colposcopy pre-procedure instructions given. Make sure to eat a meal and hydrate before appointment.  Advised 800 mg of Motrin PO with food one hour prior to appointment.    Patient verbalized understanding of preprocedure instructions and cancellation policy and will call to reschedule if will be on menses or has any concerns regarding pregnancy.  Patient is advised she will be contacted with insurance coverage information.  Cc Suzy Dixon-Patient states she will have Medicare only for coverage for visit as she is retiring from work today. She will bring copy of card.

## 2015-05-11 ENCOUNTER — Encounter: Payer: Self-pay | Admitting: Obstetrics and Gynecology

## 2015-05-11 ENCOUNTER — Ambulatory Visit (INDEPENDENT_AMBULATORY_CARE_PROVIDER_SITE_OTHER): Payer: Medicare Other | Admitting: Obstetrics and Gynecology

## 2015-05-11 VITALS — BP 150/78 | HR 70 | Resp 20 | Ht 68.0 in | Wt 284.4 lb

## 2015-05-11 DIAGNOSIS — R87811 Vaginal high risk human papillomavirus (HPV) DNA test positive: Secondary | ICD-10-CM

## 2015-05-11 DIAGNOSIS — B977 Papillomavirus as the cause of diseases classified elsewhere: Secondary | ICD-10-CM | POA: Diagnosis not present

## 2015-05-11 DIAGNOSIS — R87622 Low grade squamous intraepithelial lesion on cytologic smear of vagina (LGSIL): Secondary | ICD-10-CM | POA: Diagnosis not present

## 2015-05-11 DIAGNOSIS — N890 Mild vaginal dysplasia: Secondary | ICD-10-CM | POA: Diagnosis not present

## 2015-05-11 NOTE — Patient Instructions (Signed)

## 2015-05-11 NOTE — Progress Notes (Signed)
Subjective:     Patient ID: Leslie Webb, female   DOB: Sep 18, 1945, 70 y.o.   MRN: [id number]  HPI  Patient here for colposcopy today with pap on 04-19-15 LGSIL:Pos HR HPV. Patient asking for discussion of reasoning for colposcopy and has questions about HPV.  Abnormal pap Hx: Cryotherapy to cervix 1985. Pap 04-09-14 LGSIL:Pos HR HPV;colposcopy of vaginal cuff biopsy normal.  Status post TAH/BSO for fibroids.  Review of Systems LMP: Hysterectomy     Objective:   Physical Exam  Genitourinary:     Procedure - Colposcopy.  Consent for procedure.  Speculum placed in vagina.  3% acetic acid placed.  Small acetowhite area noted in middle of apex.  Biopsy taken and sent to pathology.  Monsel's placed.  Minimal EBL.  No complications.      Assessment:     Status post TAH/BSO.  Hx of abnormal paps in remote past and recently.  Status post colpo in 2015 for LGSIL pap and positive HR HPV.  Biopsy negative.  Current pap LGSIL and positive HR HPV.    Plan:     Discussion of abnormal paps of the cervix and vagina, HPV, vaginal dysplasia, and potential treatment options if needed.  Review of patients prior pap and colpo history.  Follow up biopsy result. Colpo instructions and precautions given.  At a minimum, needs cotesting in one year.     ___15____ minutes face to face time of which over 50% was spent in counseling.   After visit summary to patient.

## 2015-05-13 LAB — IPS OTHER TISSUE BIOPSY

## 2015-05-18 ENCOUNTER — Telehealth: Payer: Self-pay | Admitting: Emergency Medicine

## 2015-05-18 NOTE — Telephone Encounter (Signed)
08 recall created and current recall for 2016 completed.  Spoke with patient and message from Dr. Quincy Simmonds discussed. Patient voiced clear understanding of results and plan of care.  She states she had one episode of bright red vaginal bleeding from vagina on 05/16/15. Bleeding stopped after her shower. No heavy bleeding, no pain, no discharge or fevers. Patient with history of hysterectomy. Advised patient that this may be just observed and to please call back if any further bleeding or changes. Patient agreeable. Advised will send message to Dr. Quincy Simmonds to review and will call her back with any new or additional instructions.

## 2015-05-18 NOTE — Telephone Encounter (Signed)
Phone call reviewed.  No further instructions.  You may close the encounter.

## 2015-05-18 NOTE — Telephone Encounter (Signed)
-----   Message from Nunzio Cobbs, MD sent at 05/16/2015 12:06 PM EST ----- Please report results of colpo biopsy to the patient which shows LGSIL (VAIN I), i.e. Low grade dysplasia, of the vaginal cuff.   Treatment is not recommended by guidelines due to the low level of change that is present.  Usually, the changes to the cells resolve spontaneously and are not likely to result in cancer of the vagina.  This process requires time for the vaginal to heal from HPV infection, which is what causes this. I recommend that she return for cotesting in one year.  Please enter 08 recall for VAIN I.  Thank you.   Cc- Marisa Sprinkles

## 2015-05-19 NOTE — Telephone Encounter (Signed)
Message left to return call to Blayze Haen at [phone].    

## 2015-05-20 NOTE — Telephone Encounter (Signed)
Patient aware to call back with any further bleeding or changes.  Encounter closed per Dr. Quincy Simmonds.

## 2015-05-24 ENCOUNTER — Institutional Professional Consult (permissible substitution): Payer: BLUE CROSS/BLUE SHIELD | Admitting: Neurology

## 2015-09-29 DIAGNOSIS — H5213 Myopia, bilateral: Secondary | ICD-10-CM | POA: Diagnosis not present

## 2015-09-29 DIAGNOSIS — H524 Presbyopia: Secondary | ICD-10-CM | POA: Diagnosis not present

## 2015-09-29 DIAGNOSIS — H2513 Age-related nuclear cataract, bilateral: Secondary | ICD-10-CM | POA: Diagnosis not present

## 2015-10-11 DIAGNOSIS — I10 Essential (primary) hypertension: Secondary | ICD-10-CM | POA: Diagnosis not present

## 2015-10-11 DIAGNOSIS — E559 Vitamin D deficiency, unspecified: Secondary | ICD-10-CM | POA: Diagnosis not present

## 2015-10-11 DIAGNOSIS — E119 Type 2 diabetes mellitus without complications: Secondary | ICD-10-CM | POA: Diagnosis not present

## 2015-10-17 DIAGNOSIS — E559 Vitamin D deficiency, unspecified: Secondary | ICD-10-CM | POA: Diagnosis not present

## 2015-10-17 DIAGNOSIS — E7800 Pure hypercholesterolemia, unspecified: Secondary | ICD-10-CM | POA: Diagnosis not present

## 2015-10-17 DIAGNOSIS — Z780 Asymptomatic menopausal state: Secondary | ICD-10-CM | POA: Diagnosis not present

## 2015-10-17 DIAGNOSIS — I10 Essential (primary) hypertension: Secondary | ICD-10-CM | POA: Diagnosis not present

## 2015-10-17 DIAGNOSIS — Z0000 Encounter for general adult medical examination without abnormal findings: Secondary | ICD-10-CM | POA: Diagnosis not present

## 2016-01-11 DIAGNOSIS — Z23 Encounter for immunization: Secondary | ICD-10-CM | POA: Diagnosis not present

## 2016-01-11 DIAGNOSIS — R609 Edema, unspecified: Secondary | ICD-10-CM | POA: Diagnosis not present

## 2016-01-31 ENCOUNTER — Other Ambulatory Visit: Payer: Self-pay | Admitting: Nurse Practitioner

## 2016-01-31 ENCOUNTER — Other Ambulatory Visit: Payer: Self-pay | Admitting: Obstetrics & Gynecology

## 2016-01-31 DIAGNOSIS — Z1231 Encounter for screening mammogram for malignant neoplasm of breast: Secondary | ICD-10-CM

## 2016-02-09 ENCOUNTER — Ambulatory Visit
Admission: RE | Admit: 2016-02-09 | Discharge: 2016-02-09 | Disposition: A | Discharge status: Home / Self Care | Payer: Medicare Other | Source: Ambulatory Visit | Attending: Obstetrics & Gynecology | Admitting: Obstetrics & Gynecology

## 2016-02-09 DIAGNOSIS — Z1231 Encounter for screening mammogram for malignant neoplasm of breast: Secondary | ICD-10-CM

## 2016-04-11 DIAGNOSIS — I10 Essential (primary) hypertension: Secondary | ICD-10-CM | POA: Diagnosis not present

## 2016-04-11 DIAGNOSIS — Z0000 Encounter for general adult medical examination without abnormal findings: Secondary | ICD-10-CM | POA: Diagnosis not present

## 2016-04-11 DIAGNOSIS — E559 Vitamin D deficiency, unspecified: Secondary | ICD-10-CM | POA: Diagnosis not present

## 2016-04-25 ENCOUNTER — Ambulatory Visit (INDEPENDENT_AMBULATORY_CARE_PROVIDER_SITE_OTHER): Payer: Medicare Other | Admitting: Obstetrics and Gynecology

## 2016-04-25 ENCOUNTER — Encounter: Payer: Self-pay | Admitting: Obstetrics and Gynecology

## 2016-04-25 VITALS — BP 140/84 | HR 66 | Resp 20 | Ht 67.0 in | Wt 286.4 lb

## 2016-04-25 DIAGNOSIS — Z1272 Encounter for screening for malignant neoplasm of vagina: Secondary | ICD-10-CM | POA: Diagnosis not present

## 2016-04-25 DIAGNOSIS — N890 Mild vaginal dysplasia: Secondary | ICD-10-CM | POA: Diagnosis not present

## 2016-04-25 DIAGNOSIS — Z01419 Encounter for gynecological examination (general) (routine) without abnormal findings: Secondary | ICD-10-CM

## 2016-04-25 DIAGNOSIS — R87610 Atypical squamous cells of undetermined significance on cytologic smear of cervix (ASC-US): Secondary | ICD-10-CM | POA: Diagnosis not present

## 2016-04-25 NOTE — Patient Instructions (Signed)

## 2016-04-25 NOTE — Progress Notes (Signed)
70 y.o. G2P0020 Single African American female here for annual exam.    Taking a statin for hyperlipidemia.   Loss of urine with cough and sneeze.  Takes both Lasix in the afternoon. (States she is actually on a twice daily dosage.) Up once or twice per night.  If takes it in the morning, keeps her in the bathroom.   No UTIs this year.   Diet green tea made her BMs looser. Stopped this.    PCP:  Jani Gravel, MD   No LMP recorded. Patient has had a hysterectomy.    1986.       Sexually active: No. female The current method of family planning is status post hysterectomy.    Exercising: No.   Smoker:  no  Health Maintenance: Pap:  04-19-15 LSIL:Pos HR HPV History of abnormal Pap:  Yes, 04-19-15 LSIL:Pos HR HPV with colposcopy revealing LGSIL (VAIN I); hx cryotherapy to cervix 1985. Pap 04-09-14 LGSIL:Pos HR HPV;colposcopy of vaginal cuff biopsy normal. MMG:  02-09-16 Density B/Neg/BiRads1:[facility] Colonoscopy:  09/2014 diverticulosis with Dr. Lenise Herald due 04/2021. BMD:  ?12-17-06  Result:[facility] at [facility]:pt. Thinks had one more recently TDaP:  2013 Gardasil:   N/A Hep C:  She will do with PCP. Screening Labs:  Hb today: PCP, Urine today: PCP   reports that she has never smoked. She has never used smokeless tobacco. She reports that she drinks about 0.6 oz of alcohol per week . She reports that she does not use drugs.  Past Medical History:  Diagnosis Date  . Anemia   . Breast calcifications   . Fibroid   . Genital warts   . History of colon polyps   . Hypercholesterolemia   . Hypertension   . Lichen simplex chronicus 11/2007   --vulva  . Migraine   . Obesity   . STD (sexually transmitted disease)    genital warts  . Vitamin D deficiency     Past Surgical History:  Procedure Laterality Date  . ABDOMINAL HYSTERECTOMY     TAH/BSO age 89  . BREAST SURGERY     Rt. breast Bx--benign  . MYOMECTOMY     -age 89    Current Outpatient Prescriptions  Medication Sig  Dispense Refill  . amLODipine (NORVASC) 2.5 MG tablet Take 2.5 mg by mouth daily.    Marland Kitchen aspirin 81 MG tablet Take 81 mg by mouth daily.    . Cholecalciferol (VITAMIN D-3) 5000 UNITS TABS Take 1 tablet by mouth daily.    . furosemide (LASIX) 20 MG tablet Take 20 mg by mouth daily.    Marland Kitchen KLOR-CON M20 20 MEQ tablet Take 1 tablet by mouth daily.    . metoprolol succinate (TOPROL-XL) 50 MG 24 hr tablet Take 50 mg by mouth daily. Take with or immediately following a meal.    . simvastatin (ZOCOR) 10 MG tablet Take 1 tablet by mouth daily.  6   No current facility-administered medications for this visit.     Family History  Problem Relation Age of Onset  . Breast cancer Sister 6    Stage III  . Diabetes Mother   . Hypertension Mother   . Prostate cancer Father     deceased age 69  . Hypertension Brother   . Cancer Brother     neck cancer  . Hypertension Maternal Grandmother   . Pancreatic cancer Paternal Grandmother     ROS:  Pertinent items are noted in HPI.  Otherwise, a comprehensive ROS was negative.  Exam:   BP 140/84 (BP Location: Right Arm, Patient Position: Sitting, Cuff Size: Large)   Pulse 66   Resp 20   Ht 5\' 7"  (1.702 m)   Wt 286 lb 6.4 oz (129.9 kg)   BMI 44.86 kg/m     General appearance: alert, cooperative and appears stated age Head: Normocephalic, without obvious abnormality, atraumatic Neck: no adenopathy, supple, symmetrical, trachea midline and thyroid normal to inspection and palpation Lungs: clear to auscultation bilaterally Breasts: normal appearance, no masses or tenderness, No nipple retraction or dimpling, No nipple discharge or bleeding, No axillary or supraclavicular adenopathy Heart: regular rate and rhythm Abdomen: soft, non-tender; no masses, no organomegaly Extremities: extremities normal, atraumatic, no cyanosis or edema Skin: Skin color, texture, turgor normal. No rashes or lesions Lymph nodes: Cervical, supraclavicular, and axillary nodes  normal. No abnormal inguinal nodes palpated Neurologic: Grossly normal  Pelvic: External genitalia:  no lesions              Urethra:  normal appearing urethra with no masses, tenderness or lesions              Bartholins and Skenes: normal                 Vagina: normal appearing vagina with normal color and discharge, no lesions              Cervix: absent.              Pap taken: Yes.   Bimanual Exam:  Uterus:   Absent.              Adnexa: no mass, fullness, tenderness              Rectal exam: Yes.  .  Confirms.              Anus:  normal sphincter tone, no lesions  Chaperone was present for exam.  Assessment:   Well woman visit with normal exam. Status post TAH/BSO.  Hx LGSIL of vaginal cuff.  Lichen simplex chronicus.  FH of breast cancer.  Fecal incontinence.  Discussed this as a risk factor for UTIs. GSI.  States this is impacted by her Lasix.  Plan: Mammogram screening discussed. Recommended self breast awareness. Pap only done.   Discussed colposcopy if pap abnormal.  Guidelines for Calcium, Vitamin D, regular exercise program including cardiovascular and weight bearing exercise.   Follow up annually and prn.      After visit summary provided.

## 2016-04-26 ENCOUNTER — Encounter: Payer: Self-pay | Admitting: Obstetrics and Gynecology

## 2016-04-27 LAB — IPS PAP SMEAR ONLY

## 2016-05-01 ENCOUNTER — Telehealth: Payer: Self-pay

## 2016-05-01 NOTE — Addendum Note (Signed)
Addended by: Rolla Etienne E on: 05/01/2016 01:54 PM   Modules accepted: Orders

## 2016-05-01 NOTE — Telephone Encounter (Signed)
-----   Message from Nunzio Cobbs, MD sent at 04/29/2016 11:50 AM EST ----- Please have lab add HR HPV to pap already collected.  She has ASCUS and hx of VAIN I.  Cc- Marisa Sprinkles

## 2016-05-01 NOTE — Telephone Encounter (Signed)
Spoke with Myriam Jacobson in the lab. HPV testing added to patient's pap from 04/25/2016.  Routing to Dr.Silva as FYI.

## 2016-05-02 NOTE — Telephone Encounter (Signed)
Thank you.  Encounter closed.  I see the test result is pending in EPIC.

## 2016-05-04 LAB — IPS HPV ON A LIQUID BASED SPECIMEN

## 2016-05-07 ENCOUNTER — Telehealth: Payer: Self-pay

## 2016-05-07 DIAGNOSIS — R87811 Vaginal high risk human papillomavirus (HPV) DNA test positive: Principal | ICD-10-CM

## 2016-05-07 DIAGNOSIS — R87620 Atypical squamous cells of undetermined significance on cytologic smear of vagina (ASC-US): Secondary | ICD-10-CM

## 2016-05-07 NOTE — Telephone Encounter (Signed)
Spoke with patient. Advised of message as seen below from [place]. Patient verbalizes understanding. Colposcopy scheduled for 05/16/2016 at 10 am with Dr.Silva. Patient is agreeable to date and time.   Instructions given. Motrin 800 mg po x , one hour before appointment with food. Make sure to eat a meal before appointment and drink plenty of fluids. Patient agreeable and verbalized understanding of all instructions. Order placed for precert.  Routing to provider for final review. Patient agreeable to disposition. Will close encounter.

## 2016-05-07 NOTE — Telephone Encounter (Signed)
-----   Message from Nunzio Cobbs, MD sent at 05/07/2016  2:38 PM EST ----- Please inform patient of her pap showing ASCUS (atypical cells) and then the follow up HR HPV which is positive.  By protocol, she is due for another colposcopy.  Please schedule with me.   Cc- Marisa Sprinkles

## 2016-05-09 DIAGNOSIS — E559 Vitamin D deficiency, unspecified: Secondary | ICD-10-CM | POA: Diagnosis not present

## 2016-05-09 DIAGNOSIS — E7800 Pure hypercholesterolemia, unspecified: Secondary | ICD-10-CM | POA: Diagnosis not present

## 2016-05-09 DIAGNOSIS — I10 Essential (primary) hypertension: Secondary | ICD-10-CM | POA: Diagnosis not present

## 2016-05-09 DIAGNOSIS — E669 Obesity, unspecified: Secondary | ICD-10-CM | POA: Diagnosis not present

## 2016-05-16 ENCOUNTER — Ambulatory Visit: Payer: Medicare Other | Admitting: Obstetrics and Gynecology

## 2016-05-17 ENCOUNTER — Ambulatory Visit: Payer: BLUE CROSS/BLUE SHIELD | Admitting: Obstetrics and Gynecology

## 2016-05-18 ENCOUNTER — Telehealth: Payer: Self-pay | Admitting: Obstetrics and Gynecology

## 2016-05-18 ENCOUNTER — Ambulatory Visit: Payer: Medicare Other | Admitting: Obstetrics and Gynecology

## 2016-05-18 NOTE — Telephone Encounter (Signed)
Patient called and left a message when the office was closed because of inclement weather cancelling her appointment for today for a colposcopy due to continuing inclement weather conditions.  Routing to triage to reschedule.  Cc: Dr. Quincy Simmonds for [place].

## 2016-05-18 NOTE — Telephone Encounter (Signed)
Spoke with patient. Rescheduled patient colposcopy to 05/23/16 at 3pm. Patient verbalizes understanding and is agreeable to date and time.  Routing to provider for final review. Patient is agreeable to disposition. Will close encounter.

## 2016-05-21 NOTE — Progress Notes (Signed)
Subjective:     Patient ID: Leslie Webb, female   DOB: 1945-08-31, 71 y.o.   MRN: [id number]  HPI  Patient here today for colposcopy. Pap smear 04-25-16 ASCUS:Pos HR HPV.  Pap history: 04-19-15 LSIL:Pos HR HPV with colposcopy revealing LGSIL (VAIN I). Pap 04-09-14 LGSIL:Pos HR HPV;colposcopy of vaginal cuff biopsy normal. Hx of cryotherapy to cervix 1985.  Hx condyloma.  Review of Systems  LMP: Hysterectomy for fibroids.  BSO also performed. Contraception: Hysterectomy     Objective:   Physical Exam  Genitourinary:     Colposcopy Consent for procedure.  Speculum placed in vagina. 3% acetic acid placed.  White light and green light filter used.  No lesions seen.  Lugol's applied to the vagina.  Decreased uptake at the vaginal cuff and extending to left vaginal apex. Biopsy of anterior vaginal cuff to pathology.  Biopsy of left vaginal cuff to pathology.  Minimal EBL.  Monsel's applied.  No complications.     Assessment:     Status post TAH/BSO. Hx VAIN I. Current ASCUS pap and positive HR HPV.    Plan:     Discussion of abnormal paps, HPV, colposcopy, and treatment for moderate or high grade dysplasia - excision, ablation with CO2 laser, and Effudex.  Follow up biopsy results.  At a minimum, I anticipate cotesting in one year.   ____15___ minutes face to face time of which over 50% was spent in counseling.   After visit summary to patient.

## 2016-05-23 ENCOUNTER — Ambulatory Visit (INDEPENDENT_AMBULATORY_CARE_PROVIDER_SITE_OTHER): Payer: Medicare Other | Admitting: Obstetrics and Gynecology

## 2016-05-23 ENCOUNTER — Encounter: Payer: Self-pay | Admitting: Obstetrics and Gynecology

## 2016-05-23 DIAGNOSIS — R87811 Vaginal high risk human papillomavirus (HPV) DNA test positive: Secondary | ICD-10-CM | POA: Diagnosis not present

## 2016-05-23 DIAGNOSIS — R87620 Atypical squamous cells of undetermined significance on cytologic smear of vagina (ASC-US): Secondary | ICD-10-CM

## 2016-05-23 DIAGNOSIS — N890 Mild vaginal dysplasia: Secondary | ICD-10-CM | POA: Diagnosis not present

## 2016-05-23 NOTE — Patient Instructions (Signed)

## 2016-05-27 ENCOUNTER — Encounter: Payer: Self-pay | Admitting: Obstetrics and Gynecology

## 2016-05-28 LAB — IPS OTHER TISSUE BIOPSY

## 2016-05-29 ENCOUNTER — Telehealth: Payer: Self-pay | Admitting: *Deleted

## 2016-05-29 NOTE — Telephone Encounter (Signed)
Left message to call Herminio Kniskern at [phone].  08 recall placed. 

## 2016-05-29 NOTE — Telephone Encounter (Signed)
Spoke with patient, advised of results and recommendations as seen below per Dr. Silva. Patient verbalizes understanding and is agreeable.  Routing to provider for final review. Patient is agreeable to disposition. Will close encounter.  

## 2016-05-29 NOTE — Telephone Encounter (Signed)
-----   Message from Nunzio Cobbs, MD sent at 05/29/2016  9:01 AM EST ----- Please report colposcopy biopsy results to patient which show low grade dysplasia, also called VAIN I. This is what she has had in the past as well. Please place recall - 08.  Cc- Marisa Sprinkles

## 2016-08-10 DIAGNOSIS — I340 Nonrheumatic mitral (valve) insufficiency: Secondary | ICD-10-CM | POA: Diagnosis not present

## 2016-08-10 DIAGNOSIS — E7800 Pure hypercholesterolemia, unspecified: Secondary | ICD-10-CM | POA: Diagnosis not present

## 2016-08-10 DIAGNOSIS — R079 Chest pain, unspecified: Secondary | ICD-10-CM | POA: Diagnosis not present

## 2016-08-10 DIAGNOSIS — E079 Disorder of thyroid, unspecified: Secondary | ICD-10-CM | POA: Diagnosis not present

## 2016-08-21 DIAGNOSIS — R0789 Other chest pain: Secondary | ICD-10-CM | POA: Diagnosis not present

## 2016-08-21 DIAGNOSIS — R002 Palpitations: Secondary | ICD-10-CM | POA: Diagnosis not present

## 2016-08-21 DIAGNOSIS — Z6841 Body Mass Index (BMI) 40.0 and over, adult: Secondary | ICD-10-CM | POA: Diagnosis not present

## 2016-09-06 DIAGNOSIS — I340 Nonrheumatic mitral (valve) insufficiency: Secondary | ICD-10-CM | POA: Diagnosis not present

## 2016-09-06 DIAGNOSIS — R0602 Shortness of breath: Secondary | ICD-10-CM | POA: Diagnosis not present

## 2016-10-03 DIAGNOSIS — R0602 Shortness of breath: Secondary | ICD-10-CM | POA: Diagnosis not present

## 2016-10-03 DIAGNOSIS — Z6841 Body Mass Index (BMI) 40.0 and over, adult: Secondary | ICD-10-CM | POA: Diagnosis not present

## 2016-10-03 DIAGNOSIS — R0789 Other chest pain: Secondary | ICD-10-CM | POA: Diagnosis not present

## 2016-11-06 DIAGNOSIS — Z1159 Encounter for screening for other viral diseases: Secondary | ICD-10-CM | POA: Diagnosis not present

## 2016-11-06 DIAGNOSIS — I10 Essential (primary) hypertension: Secondary | ICD-10-CM | POA: Diagnosis not present

## 2016-11-06 DIAGNOSIS — E7800 Pure hypercholesterolemia, unspecified: Secondary | ICD-10-CM | POA: Diagnosis not present

## 2016-11-15 DIAGNOSIS — R0789 Other chest pain: Secondary | ICD-10-CM | POA: Diagnosis not present

## 2016-11-15 DIAGNOSIS — R0602 Shortness of breath: Secondary | ICD-10-CM | POA: Diagnosis not present

## 2016-11-15 DIAGNOSIS — Z6841 Body Mass Index (BMI) 40.0 and over, adult: Secondary | ICD-10-CM | POA: Diagnosis not present

## 2017-01-01 ENCOUNTER — Other Ambulatory Visit: Payer: Self-pay | Admitting: Obstetrics & Gynecology

## 2017-01-01 DIAGNOSIS — Z1231 Encounter for screening mammogram for malignant neoplasm of breast: Secondary | ICD-10-CM

## 2017-02-08 DIAGNOSIS — Z23 Encounter for immunization: Secondary | ICD-10-CM | POA: Diagnosis not present

## 2017-02-13 ENCOUNTER — Ambulatory Visit
Admission: RE | Admit: 2017-02-13 | Discharge: 2017-02-13 | Disposition: A | Discharge status: Home / Self Care | Payer: Medicare Other | Source: Ambulatory Visit | Attending: Obstetrics & Gynecology | Admitting: Obstetrics & Gynecology

## 2017-02-13 DIAGNOSIS — Z1231 Encounter for screening mammogram for malignant neoplasm of breast: Secondary | ICD-10-CM | POA: Diagnosis not present

## 2017-02-14 DIAGNOSIS — R0602 Shortness of breath: Secondary | ICD-10-CM | POA: Diagnosis not present

## 2017-02-14 DIAGNOSIS — R0789 Other chest pain: Secondary | ICD-10-CM | POA: Diagnosis not present

## 2017-02-14 DIAGNOSIS — I10 Essential (primary) hypertension: Secondary | ICD-10-CM | POA: Diagnosis not present

## 2017-03-04 DIAGNOSIS — I10 Essential (primary) hypertension: Secondary | ICD-10-CM | POA: Diagnosis not present

## 2017-04-01 DIAGNOSIS — I10 Essential (primary) hypertension: Secondary | ICD-10-CM | POA: Diagnosis not present

## 2017-04-01 DIAGNOSIS — R0602 Shortness of breath: Secondary | ICD-10-CM | POA: Diagnosis not present

## 2017-04-01 DIAGNOSIS — Z6841 Body Mass Index (BMI) 40.0 and over, adult: Secondary | ICD-10-CM | POA: Diagnosis not present

## 2017-05-08 ENCOUNTER — Telehealth: Payer: Self-pay | Admitting: *Deleted

## 2017-05-08 NOTE — Telephone Encounter (Signed)
Patient in 08 recall for 03/2017. Please contact patient to schedule AEX /PAP Thanks

## 2017-05-09 DIAGNOSIS — Z0000 Encounter for general adult medical examination without abnormal findings: Secondary | ICD-10-CM | POA: Diagnosis not present

## 2017-05-09 DIAGNOSIS — I10 Essential (primary) hypertension: Secondary | ICD-10-CM | POA: Diagnosis not present

## 2017-05-14 DIAGNOSIS — E7800 Pure hypercholesterolemia, unspecified: Secondary | ICD-10-CM | POA: Diagnosis not present

## 2017-05-14 DIAGNOSIS — Z0000 Encounter for general adult medical examination without abnormal findings: Secondary | ICD-10-CM | POA: Diagnosis not present

## 2017-05-29 NOTE — Telephone Encounter (Signed)
Spoke with patient and made appointment for AEX08 pap for 05-31-17 9:00am with Dr.Silva.

## 2017-05-30 NOTE — Progress Notes (Signed)
72 y.o. G35P0020 Single African American female here for annual exam.    Questions about HPV.  PCP:   Dr. Jani Gravel  No LMP recorded. Patient has had a hysterectomy.           Sexually active: No.  The current method of family planning is status post hysterectomy.    Exercising: No.  The patient does not participate in regular exercise at present. Smoker:  no  Health Maintenance: Pap: 05/23/16 Colposcopy showed low grade dysplasia (VAIN I). 04-25-16 ASCUS:Pos HR HPV  04-19-15 LSIL:Pos HR HPV with colposcopy revealing LGSIL (VAIN I). Pap 04-09-14 LGSIL:Pos HR HPV;colposcopy of vaginal cuff biopsy normal. Hx of cryotherapy to cervix 1985. History of abnormal Pap:  yes MMG:  02/13/17 BIRADS 1 negative/density b Colonoscopy:   09/2014 diverticulosis with Dr. Lenise Herald due 05/2019 BMD:   12/19/06  Result  Normal TDaP:  2013 Gardasil:   n/a Hep C: possibly done with PCP Screening Labs: PCP   reports that  has never smoked. she has never used smokeless tobacco. She reports that she drinks about 0.6 oz of alcohol per week. She reports that she does not use drugs.  Past Medical History:  Diagnosis Date  . Anemia   . Breast calcifications   . Fibroid   . Genital warts   . History of colon polyps   . Hypercholesterolemia   . Hypertension   . Lichen simplex chronicus 11/2007   --vulva  . Migraine   . Obesity   . STD (sexually transmitted disease)    genital warts  . Vitamin D deficiency     Past Surgical History:  Procedure Laterality Date  . ABDOMINAL HYSTERECTOMY     TAH/BSO age 21  . BREAST SURGERY     Rt. breast Bx--benign  . MYOMECTOMY     -age 6    Current Outpatient Medications  Medication Sig Dispense Refill  . aspirin 81 MG tablet Take 81 mg by mouth daily.    . Cholecalciferol (VITAMIN D-3) 5000 UNITS TABS Take 1 tablet by mouth daily.    . Coenzyme Q10 (CO Q-10) 300 MG CAPS Take by mouth daily.    . furosemide (LASIX) 20 MG tablet Take 20 mg by mouth daily.     Marland Kitchen KLOR-CON M20 20 MEQ tablet Take 1 tablet by mouth daily.    Marland Kitchen losartan (COZAAR) 25 MG tablet TK 1 T PO D  1  . simvastatin (ZOCOR) 10 MG tablet Take 1 tablet by mouth daily.  6  . verapamil (VERELAN PM) 240 MG 24 hr capsule Take 240 mg by mouth at bedtime.     No current facility-administered medications for this visit.     Family History  Problem Relation Age of Onset  . Breast cancer Sister 2       Stage III  . Diabetes Mother   . Hypertension Mother   . Prostate cancer Father        deceased age 62  . Hypertension Brother   . Cancer Brother        neck cancer  . Hypertension Maternal Grandmother   . Pancreatic cancer Paternal Grandmother     ROS:  Pertinent items are noted in HPI.  Otherwise, a comprehensive ROS was negative.  Exam:   BP 138/76 (BP Location: Right Arm, Patient Position: Sitting, Cuff Size: Large)   Pulse 60   Resp 14   Ht 5\' 7"  (1.702 m)   Wt 273 lb (123.8 kg)   BMI  42.76 kg/m     General appearance: alert, cooperative and appears stated age Head: Normocephalic, without obvious abnormality, atraumatic Neck: no adenopathy, supple, symmetrical, trachea midline and thyroid normal to inspection and palpation Lungs: clear to auscultation bilaterally Breasts: normal appearance, no masses or tenderness, No nipple retraction or dimpling, No nipple discharge or bleeding, No axillary or supraclavicular adenopathy Heart: irregular rhythm, normal rate.  PAC vs PVC? Abdomen: soft, non-tender; no masses, no organomegaly Extremities: extremities normal, atraumatic, no cyanosis or edema Skin: Skin color, texture, turgor normal. No rashes or lesions Lymph nodes: Cervical, supraclavicular, and axillary nodes normal. No abnormal inguinal nodes palpated Neurologic: Grossly normal  Pelvic: External genitalia:  no lesions              Urethra:  normal appearing urethra with no masses, tenderness or lesions              Bartholins and Skenes: normal                  Vagina: normal appearing vagina with normal color and discharge, no lesions              Cervix:  Absent.              Pap taken: Yes.   Bimanual Exam:  Uterus:   Absent.               Adnexa: no mass, fullness, tenderness              Rectal exam: Yes.  .  Confirms.              Anus:  normal sphincter tone, no lesions  Chaperone was present for exam.  Assessment:   Well woman visit with normal exam. Status post TAH/BSO.  Hx LGSIL of vaginal cuff.  Positive HPV HPV. Lichen simplex chronicus.  FH of breast cancer.  Irregular heart rhythm.    Plan: Mammogram screening discussed. Recommended self breast awareness. Pap and HR HPV as above.    Guidelines for Calcium, Vitamin D, regular exercise program including cardiovascular and weight bearing exercise. Discussion of HPV, abnormal paps, vaginal dysplasia, signs and symptoms of vaginal cancer.  Labs and vaccines with PCP.  Follow up with PCP.  Follow up annually and prn.    After visit summary provided.

## 2017-05-31 ENCOUNTER — Encounter: Payer: Self-pay | Admitting: Obstetrics and Gynecology

## 2017-05-31 ENCOUNTER — Other Ambulatory Visit (HOSPITAL_COMMUNITY)
Admission: RE | Admit: 2017-05-31 | Discharge: 2017-05-31 | Disposition: A | Discharge status: Home / Self Care | Payer: Medicare Other | Source: Ambulatory Visit | Attending: Obstetrics and Gynecology | Admitting: Obstetrics and Gynecology

## 2017-05-31 ENCOUNTER — Other Ambulatory Visit: Payer: Self-pay

## 2017-05-31 ENCOUNTER — Ambulatory Visit (INDEPENDENT_AMBULATORY_CARE_PROVIDER_SITE_OTHER): Payer: Medicare Other | Admitting: Obstetrics and Gynecology

## 2017-05-31 VITALS — BP 138/76 | HR 60 | Resp 14 | Ht 67.0 in | Wt 273.0 lb

## 2017-05-31 DIAGNOSIS — Z90710 Acquired absence of both cervix and uterus: Secondary | ICD-10-CM | POA: Insufficient documentation

## 2017-05-31 DIAGNOSIS — E7800 Pure hypercholesterolemia, unspecified: Secondary | ICD-10-CM | POA: Diagnosis not present

## 2017-05-31 DIAGNOSIS — E669 Obesity, unspecified: Secondary | ICD-10-CM | POA: Insufficient documentation

## 2017-05-31 DIAGNOSIS — L280 Lichen simplex chronicus: Secondary | ICD-10-CM | POA: Diagnosis not present

## 2017-05-31 DIAGNOSIS — I10 Essential (primary) hypertension: Secondary | ICD-10-CM | POA: Insufficient documentation

## 2017-05-31 DIAGNOSIS — Z01419 Encounter for gynecological examination (general) (routine) without abnormal findings: Secondary | ICD-10-CM | POA: Insufficient documentation

## 2017-05-31 DIAGNOSIS — G43909 Migraine, unspecified, not intractable, without status migrainosus: Secondary | ICD-10-CM | POA: Insufficient documentation

## 2017-05-31 NOTE — Patient Instructions (Signed)

## 2017-06-04 LAB — CYTOLOGY - PAP: HPV: DETECTED — AB

## 2017-06-05 ENCOUNTER — Telehealth: Payer: Self-pay | Admitting: *Deleted

## 2017-06-05 DIAGNOSIS — R87622 Low grade squamous intraepithelial lesion on cytologic smear of vagina (LGSIL): Secondary | ICD-10-CM

## 2017-06-05 DIAGNOSIS — H2513 Age-related nuclear cataract, bilateral: Secondary | ICD-10-CM | POA: Diagnosis not present

## 2017-06-05 DIAGNOSIS — B977 Papillomavirus as the cause of diseases classified elsewhere: Secondary | ICD-10-CM

## 2017-06-05 NOTE — Telephone Encounter (Signed)
Colpo order placed. Routing to business office for precert.   Notes recorded by Burnice Logan, RN on 06/05/2017 at 9:49 AM EST Spoke with patient, advised as seen below per Dr. Quincy Simmonds. Contraceptive: postmenopausal. Colpo scheduled for 06/26/17 at 10:30 am with Dr. Quincy Simmonds. Patient declined earlier appt offered, states she will be traveling. Patient can not take motrin, advised to take 2 regular strength Tylenol with food and water one hour before procedure. Patient verbalizes understanding and is agreeable.   See telephone encounter dated 06/05/17 to review with provider and precert.    Cc: Dr. Quincy Simmonds

## 2017-06-05 NOTE — Telephone Encounter (Signed)
-----   Message from Nunzio Cobbs, MD sent at 06/05/2017  5:04 AM EST ----- Please contact patient with results of pap showing LGSIL and positive high risk HPV.  I am recommending a colposcopy with me.  She has had this is in the past, so this is not her first colposcopy.

## 2017-06-26 ENCOUNTER — Ambulatory Visit (INDEPENDENT_AMBULATORY_CARE_PROVIDER_SITE_OTHER): Payer: Medicare Other | Admitting: Obstetrics and Gynecology

## 2017-06-26 ENCOUNTER — Other Ambulatory Visit: Payer: Self-pay

## 2017-06-26 ENCOUNTER — Encounter: Payer: Self-pay | Admitting: Obstetrics and Gynecology

## 2017-06-26 DIAGNOSIS — R87622 Low grade squamous intraepithelial lesion on cytologic smear of vagina (LGSIL): Secondary | ICD-10-CM | POA: Diagnosis not present

## 2017-06-26 DIAGNOSIS — N890 Mild vaginal dysplasia: Secondary | ICD-10-CM | POA: Diagnosis not present

## 2017-06-26 DIAGNOSIS — B977 Papillomavirus as the cause of diseases classified elsewhere: Secondary | ICD-10-CM

## 2017-06-26 NOTE — Patient Instructions (Signed)

## 2017-06-26 NOTE — Progress Notes (Signed)
syrSubjective:     Patient ID: Leslie Webb, female   DOB: 08/23/1945, 72 y.o.   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 04-25-16 ASCUS:Pos HR HPV             04-19-15 LSIL:Pos HR HPV with colposcopy revealing LGSIL (VAIN I).Pap 04-09-14 LGSIL:Pos HR HPV;colposcopy of vaginal cuff biopsy normal. Hx of cryotherapy to cervix 1985.   Review of Systems  LMP: about October 1985 Contraception: Hysterectomy and Postmenopausal     Objective:   Physical Exam  Genitourinary:     Colposcopy Consent for procedure.  3% acetic acid used.  Cervix absent. No real acetowhite changes noted.  Lugol's placed in the entire vagina.  Decreased uptake left vaginal sidewall close to apex. Biopsy taken and to path.  monsel's placed.  Minimal EBL.  No complications.    Assessment:     VAIN I.  Positive HR HPV.     Plan:     Follow up biopsy.  Instructions/precautions given.  Pap and HR HPV in one year at a minimum.  After visit summary to patient.

## 2017-07-02 ENCOUNTER — Telehealth: Payer: Self-pay

## 2017-07-02 NOTE — Telephone Encounter (Signed)
-----   Message from Nunzio Cobbs, MD sent at 07/01/2017  7:07 PM EST ----- Please report colposcopy vaginal biopsy showing normal vaginal tissue.  Pap recall - 08. Last pap was LGSIL and positive HR HPV.

## 2017-07-02 NOTE — Telephone Encounter (Signed)
Left message to call Kaitlyn at [phone]. 

## 2017-07-02 NOTE — Telephone Encounter (Signed)
Spoke with patient. Results given. 08 recall entered. Encounter closed. 

## 2017-11-13 DIAGNOSIS — E7800 Pure hypercholesterolemia, unspecified: Secondary | ICD-10-CM | POA: Diagnosis not present

## 2017-11-19 DIAGNOSIS — Z780 Asymptomatic menopausal state: Secondary | ICD-10-CM | POA: Diagnosis not present

## 2017-11-19 DIAGNOSIS — E559 Vitamin D deficiency, unspecified: Secondary | ICD-10-CM | POA: Diagnosis not present

## 2017-11-19 DIAGNOSIS — R1013 Epigastric pain: Secondary | ICD-10-CM | POA: Diagnosis not present

## 2017-11-19 DIAGNOSIS — Z0000 Encounter for general adult medical examination without abnormal findings: Secondary | ICD-10-CM | POA: Diagnosis not present

## 2017-11-19 DIAGNOSIS — E7800 Pure hypercholesterolemia, unspecified: Secondary | ICD-10-CM | POA: Diagnosis not present

## 2017-11-19 DIAGNOSIS — E119 Type 2 diabetes mellitus without complications: Secondary | ICD-10-CM | POA: Diagnosis not present

## 2017-11-19 DIAGNOSIS — I10 Essential (primary) hypertension: Secondary | ICD-10-CM | POA: Diagnosis not present

## 2017-11-20 ENCOUNTER — Other Ambulatory Visit: Payer: Self-pay | Admitting: Internal Medicine

## 2017-11-20 DIAGNOSIS — R1013 Epigastric pain: Secondary | ICD-10-CM

## 2017-12-03 ENCOUNTER — Ambulatory Visit
Admission: RE | Admit: 2017-12-03 | Discharge: 2017-12-03 | Disposition: A | Discharge status: Home / Self Care | Payer: Medicare Other | Source: Ambulatory Visit | Attending: Internal Medicine | Admitting: Internal Medicine

## 2017-12-03 DIAGNOSIS — K5730 Diverticulosis of large intestine without perforation or abscess without bleeding: Secondary | ICD-10-CM | POA: Diagnosis not present

## 2017-12-03 DIAGNOSIS — R1013 Epigastric pain: Secondary | ICD-10-CM

## 2017-12-03 MED ORDER — IOPAMIDOL (ISOVUE-300) INJECTION 61%
100.0000 mL | Freq: Once | INTRAVENOUS | Status: AC | PRN
  Administered 2017-12-03: 100 mL via INTRAVENOUS

## 2017-12-11 DIAGNOSIS — R1013 Epigastric pain: Secondary | ICD-10-CM | POA: Diagnosis not present

## 2017-12-11 DIAGNOSIS — I10 Essential (primary) hypertension: Secondary | ICD-10-CM | POA: Diagnosis not present

## 2017-12-11 DIAGNOSIS — E7800 Pure hypercholesterolemia, unspecified: Secondary | ICD-10-CM | POA: Diagnosis not present

## 2017-12-11 DIAGNOSIS — R21 Rash and other nonspecific skin eruption: Secondary | ICD-10-CM | POA: Diagnosis not present

## 2018-01-06 ENCOUNTER — Other Ambulatory Visit: Payer: Self-pay | Admitting: Obstetrics and Gynecology

## 2018-01-06 DIAGNOSIS — Z1231 Encounter for screening mammogram for malignant neoplasm of breast: Secondary | ICD-10-CM

## 2018-01-10 DIAGNOSIS — Z23 Encounter for immunization: Secondary | ICD-10-CM | POA: Diagnosis not present

## 2018-02-17 ENCOUNTER — Ambulatory Visit
Admission: RE | Admit: 2018-02-17 | Discharge: 2018-02-17 | Disposition: A | Discharge status: Home / Self Care | Payer: Medicare Other | Source: Ambulatory Visit | Attending: Obstetrics and Gynecology | Admitting: Obstetrics and Gynecology

## 2018-02-17 DIAGNOSIS — Z1231 Encounter for screening mammogram for malignant neoplasm of breast: Secondary | ICD-10-CM | POA: Diagnosis not present

## 2018-05-14 DIAGNOSIS — E559 Vitamin D deficiency, unspecified: Secondary | ICD-10-CM | POA: Diagnosis not present

## 2018-05-14 DIAGNOSIS — I10 Essential (primary) hypertension: Secondary | ICD-10-CM | POA: Diagnosis not present

## 2018-05-14 DIAGNOSIS — E7800 Pure hypercholesterolemia, unspecified: Secondary | ICD-10-CM | POA: Diagnosis not present

## 2018-05-19 DIAGNOSIS — Z780 Asymptomatic menopausal state: Secondary | ICD-10-CM | POA: Diagnosis not present

## 2018-05-19 DIAGNOSIS — E559 Vitamin D deficiency, unspecified: Secondary | ICD-10-CM | POA: Diagnosis not present

## 2018-05-19 DIAGNOSIS — Z0000 Encounter for general adult medical examination without abnormal findings: Secondary | ICD-10-CM | POA: Diagnosis not present

## 2018-05-19 DIAGNOSIS — I10 Essential (primary) hypertension: Secondary | ICD-10-CM | POA: Diagnosis not present

## 2018-05-19 DIAGNOSIS — E7800 Pure hypercholesterolemia, unspecified: Secondary | ICD-10-CM | POA: Diagnosis not present

## 2018-05-19 DIAGNOSIS — K219 Gastro-esophageal reflux disease without esophagitis: Secondary | ICD-10-CM | POA: Diagnosis not present

## 2018-05-30 NOTE — Progress Notes (Signed)
73 y.o. G41P0020 Single African American female here for annual exam.    Was taking too much vit D, so this was discontinued.  She will recheck this in March.   Loss of urine with cough, sneeze. Lasix makes this worse.  Using a panty liner.   PCP:  Jani Gravel, MD   Patient's last menstrual period was 01/29/1984 (within months).           Sexually active: No.  The current method of family planning is status post hysterectomy.    Exercising: No.  The patient does not participate in regular exercise at present. Smoker:  no  Health Maintenance: Pap: 05-31-17 LGSIL:Pos HR HPV.  Colposcopy 06/26/17 - benign.  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 04-25-16 ASCUS:Pos HR HPV 04-19-15 LSIL:Pos HR HPV with colposcopy revealing LGSIL (VAIN I).Pap 04-09-14 LGSIL:Pos HR HPV;colposcopy of vaginal cuff biopsy normal. Hx of cryotherapy to cervix 1985. MMG: 02-17-18 3D Neg/density b/BiRads1 Colonoscopy: 09/2014 diverticulosis with Dr. Lenise Herald due 05/2019 BMD: Unsure--?2008  Result :Normal--Pt. To schedule wth Dr.Kim.   TDaP: 2013 Gardasil:   no HIV:no Hep C: no Screening Labs:  Hb today: PCP   reports that she has never smoked. She has never used smokeless tobacco. She reports current alcohol use of about 1.0 standard drinks of alcohol per week. She reports that she does not use drugs.  Past Medical History:  Diagnosis Date  . Anemia   . Breast calcifications   . Fibroid   . Genital warts   . History of colon polyps   . Hypercholesterolemia   . Hypertension   . Lichen simplex chronicus 11/2007   --vulva  . Migraine   . Obesity   . STD (sexually transmitted disease)    genital warts  . Vitamin D deficiency     Past Surgical History:  Procedure Laterality Date  . ABDOMINAL HYSTERECTOMY     TAH/BSO age 36  . BREAST BIOPSY Right    benign  . BREAST SURGERY     Rt. breast Bx--benign  . MYOMECTOMY     -age 89    Current  Outpatient Medications  Medication Sig Dispense Refill  . aspirin 81 MG tablet Take 81 mg by mouth daily.    . Coenzyme Q10 (CO Q-10) 300 MG CAPS Take by mouth daily.    . furosemide (LASIX) 20 MG tablet Take 20 mg by mouth daily.    Marland Kitchen KLOR-CON M20 20 MEQ tablet Take 1 tablet by mouth daily.    Marland Kitchen losartan (COZAAR) 25 MG tablet TK 1 T PO D  1  . simvastatin (ZOCOR) 10 MG tablet Take 1 tablet by mouth daily.  6  . verapamil (VERELAN PM) 240 MG 24 hr capsule Take 240 mg by mouth at bedtime.    . Cholecalciferol (VITAMIN D-3) 5000 UNITS TABS Take 1 tablet by mouth daily.     No current facility-administered medications for this visit.     Family History  Problem Relation Age of Onset  . Breast cancer Sister 56       Stage III  . Cancer Sister 63       Lung ca  . Diabetes Mother   . Hypertension Mother   . Prostate cancer Father        deceased age 57  . Hypertension Brother   . Cancer Brother        neck cancer  . Hypertension Maternal Grandmother   .  Pancreatic cancer Paternal Grandmother     Review of Systems  Cardiovascular: Positive for leg swelling.  Gastrointestinal: Positive for constipation.  Genitourinary:       Loss of urine  All other systems reviewed and are negative.   Exam:   BP 140/88 (BP Location: Right Arm, Patient Position: Sitting, Cuff Size: Large)   Pulse 80   Resp 18   Ht 5\' 7"  (1.702 m)   Wt 280 lb 6.4 oz (127.2 kg)   LMP 01/29/1984 (Within Months)   BMI 43.92 kg/m     General appearance: alert, cooperative and appears stated age Head: Normocephalic, without obvious abnormality, atraumatic Neck: no adenopathy, supple, symmetrical, trachea midline and thyroid normal to inspection and palpation Lungs: clear to auscultation bilaterally Breasts: normal appearance, no masses or tenderness, No nipple retraction or dimpling, No nipple discharge or bleeding, No axillary or supraclavicular adenopathy Heart: regular rate and rhythm Abdomen: soft,  non-tender; no masses, no organomegaly Extremities: extremities normal, atraumatic, no cyanosis or edema Skin: Skin color, texture, turgor normal. No rashes or lesions Lymph nodes: Cervical, supraclavicular, and axillary nodes normal. No abnormal inguinal nodes palpated Neurologic: Grossly normal  Pelvic: External genitalia:  no lesions              Urethra:  normal appearing urethra with no masses, tenderness or lesions              Bartholins and Skenes: normal                 Vagina: normal appearing vagina with normal color and discharge, no lesions              Cervix:  absent              Pap taken: Yes.   Bimanual Exam:  Uterus:  absent              Adnexa: no mass, fullness, tenderness              Rectal exam: Yes.  .  Confirms.              Anus:  normal sphincter tone, no lesions  Chaperone was present for exam.  Assessment:   Well woman visit with normal exam. Status post TAH/BSO.  Hx LGSIL of vaginal cuff.  Positive HPV HPV. Hx lichen simplex chronicus.  FH of breast cancer. Sister. Irregular heart rhythm.  Long standing.   Plan: Mammogram screening. Recommended self breast awareness. Pap and HR HPV as above. Guidelines for Calcium, Vitamin D, regular exercise program including cardiovascular and weight bearing exercise. PCP following cardiac arrhythmia. We discussed Kegel exercises and pelvic floor PT.   Follow up annually and prn.   After visit summary provided.

## 2018-06-02 ENCOUNTER — Encounter: Payer: Self-pay | Admitting: Obstetrics and Gynecology

## 2018-06-02 ENCOUNTER — Ambulatory Visit (INDEPENDENT_AMBULATORY_CARE_PROVIDER_SITE_OTHER): Payer: Medicare Other | Admitting: Obstetrics and Gynecology

## 2018-06-02 ENCOUNTER — Other Ambulatory Visit (HOSPITAL_COMMUNITY)
Admission: RE | Admit: 2018-06-02 | Discharge: 2018-06-02 | Disposition: A | Discharge status: Home / Self Care | Payer: Medicare Other | Source: Ambulatory Visit | Attending: Obstetrics and Gynecology | Admitting: Obstetrics and Gynecology

## 2018-06-02 ENCOUNTER — Other Ambulatory Visit: Payer: Self-pay

## 2018-06-02 VITALS — BP 140/88 | HR 80 | Resp 18 | Ht 67.0 in | Wt 280.4 lb

## 2018-06-02 DIAGNOSIS — Z01419 Encounter for gynecological examination (general) (routine) without abnormal findings: Secondary | ICD-10-CM | POA: Insufficient documentation

## 2018-06-02 NOTE — Patient Instructions (Signed)
EXERCISE AND DIET:  We recommended that you start or continue a regular exercise program for good health. Regular exercise means any activity that makes your heart beat faster and makes you sweat.  We recommend exercising at least 30 minutes per day at least 3 days a week, preferably 4 or 5.  We also recommend a diet low in fat and sugar.  Inactivity, poor dietary choices and obesity can cause diabetes, heart attack, stroke, and kidney damage, among others.    ALCOHOL AND SMOKING:  Women should limit their alcohol intake to no more than 7 drinks/beers/glasses of wine (combined, not each!) per week. Moderation of alcohol intake to this level decreases your risk of breast cancer and liver damage. And of course, no recreational drugs are part of a healthy lifestyle.  And absolutely no smoking or even second hand smoke. Most people know smoking can cause heart and lung diseases, but did you know it also contributes to weakening of your bones? Aging of your skin?  Yellowing of your teeth and nails?  CALCIUM AND VITAMIN D:  Adequate intake of calcium and Vitamin D are recommended.  The recommendations for exact amounts of these supplements seem to change often, but generally speaking 600 mg of calcium (either carbonate or citrate) and 800 units of Vitamin D per day seems prudent. Certain women may benefit from higher intake of Vitamin D.  If you are among these women, your doctor will have told you during your visit.    PAP SMEARS:  Pap smears, to check for cervical cancer or precancers,  have traditionally been done yearly, although recent scientific advances have shown that most women can have pap smears less often.  However, every woman still should have a physical exam from her gynecologist every year. It will include a breast check, inspection of the vulva and vagina to check for abnormal growths or skin changes, a visual exam of the cervix, and then an exam to evaluate the size and shape of the uterus and  ovaries.  And after 74 years of age, a rectal exam is indicated to check for rectal cancers. We will also provide age appropriate advice regarding health maintenance, like when you should have certain vaccines, screening for sexually transmitted diseases, bone density testing, colonoscopy, mammograms, etc.   MAMMOGRAMS:  All women over 40 years old should have a yearly mammogram. Many facilities now offer a "3D" mammogram, which may cost around $50 extra out of pocket. If possible,  we recommend you accept the option to have the 3D mammogram performed.  It both reduces the number of women who will be called back for extra views which then turn out to be normal, and it is better than the routine mammogram at detecting truly abnormal areas.    COLONOSCOPY:  Colonoscopy to screen for colon cancer is recommended for all women at age 50.  We know, you hate the idea of the prep.  We agree, BUT, having colon cancer and not knowing it is worse!!  Colon cancer so often starts as a polyp that can be seen and removed at colonscopy, which can quite literally save your life!  And if your first colonoscopy is normal and you have no family history of colon cancer, most women don't have to have it again for 10 years.  Once every ten years, you can do something that may end up saving your life, right?  We will be happy to help you get it scheduled when you are ready.    Be sure to check your insurance coverage so you understand how much it will cost.  It may be covered as a preventative service at no cost, but you should check your particular policy.   ° ° ° ° °Kegel Exercises °Kegel exercises help strengthen the muscles that support the rectum, vagina, small intestine, bladder, and uterus. Doing Kegel exercises can help: °· Improve bladder and bowel control. °· Improve sexual response. °· Reduce problems and discomfort during pregnancy. °Kegel exercises involve squeezing your pelvic floor muscles, which are the same muscles you  squeeze when you try to stop the flow of urine. The exercises can be done while sitting, standing, or lying down, but it is best to vary your position. °Exercises °1. Squeeze your pelvic floor muscles tight. You should feel a tight lift in your rectal area. If you are a female, you should also feel a tightness in your vaginal area. Keep your stomach, buttocks, and legs relaxed. °2. Hold the muscles tight for up to 10 seconds. °3. Relax your muscles. °Repeat this exercise 50 times a day or as many times as told by your health care provider. Continue to do this exercise for at least 4-6 weeks or for as long as told by your health care provider. °This information is not intended to replace advice given to you by your health care provider. Make sure you discuss any questions you have with your health care provider. °Document Released: 04/02/2012 Document Revised: 08/27/2016 Document Reviewed: 03/06/2015 °Elsevier Interactive Patient Education © 2019 [employer]. ° °

## 2018-06-05 LAB — CYTOLOGY - PAP
Diagnosis: NEGATIVE
HPV: DETECTED — AB

## 2018-06-11 ENCOUNTER — Other Ambulatory Visit: Payer: Self-pay | Admitting: *Deleted

## 2018-06-11 DIAGNOSIS — R87622 Low grade squamous intraepithelial lesion on cytologic smear of vagina (LGSIL): Secondary | ICD-10-CM

## 2018-06-11 DIAGNOSIS — B977 Papillomavirus as the cause of diseases classified elsewhere: Secondary | ICD-10-CM

## 2018-06-18 ENCOUNTER — Other Ambulatory Visit: Payer: Self-pay

## 2018-06-18 ENCOUNTER — Encounter: Payer: Self-pay | Admitting: Obstetrics and Gynecology

## 2018-06-18 ENCOUNTER — Ambulatory Visit: Payer: Self-pay

## 2018-06-18 ENCOUNTER — Ambulatory Visit (INDEPENDENT_AMBULATORY_CARE_PROVIDER_SITE_OTHER): Payer: Medicare Other | Admitting: Obstetrics and Gynecology

## 2018-06-18 DIAGNOSIS — B977 Papillomavirus as the cause of diseases classified elsewhere: Secondary | ICD-10-CM

## 2018-06-18 DIAGNOSIS — N952 Postmenopausal atrophic vaginitis: Secondary | ICD-10-CM | POA: Diagnosis not present

## 2018-06-18 NOTE — Patient Instructions (Signed)
Colposcopy, Care After  This sheet gives you information about how to care for yourself after your procedure. Your health care provider may also give you more specific instructions. If you have problems or questions, contact your health care provider.  What can I expect after the procedure?  If you had a colposcopy without a biopsy, you can expect to feel fine right away, but you may have some spotting for a few days. You can go back to your normal activities.  If you had a colposcopy with a biopsy, it is common to have:   Soreness and pain. This may last for a few days.   Light-headedness.   Mild vaginal bleeding or dark-colored, grainy discharge. This may last for a few days. The discharge may be due to a solution that was used during the procedure. You may need to wear a sanitary pad during this time.   Spotting for at least 48 hours after the procedure.  Follow these instructions at home:     Take over-the-counter and prescription medicines only as told by your health care provider. Talk with your health care provider about what type of over-the-counter pain medicine and prescription medicine you can start taking again. It is especially important to talk with your health care provider if you take blood-thinning medicine.   Do not drive or use heavy machinery while taking prescription pain medicine.   For at least 3 days after your procedure, or as long as told by your health care provider, avoid:  ? Douching.  ? Using tampons.  ? Having sexual intercourse.   Continue to use birth control (contraception).   Limit your physical activity for the first day after the procedure as told by your health care provider. Ask your health care provider what activities are safe for you.   It is up to you to get the results of your procedure. Ask your health care provider, or the department performing the procedure, when your results will be ready.   Keep all follow-up visits as told by your health care provider.  This is important.  Contact a health care provider if:   You develop a skin rash.  Get help right away if:   You are bleeding heavily from your vagina or you are passing blood clots. This includes using more than one sanitary pad per hour for 2 hours in a row.   You have a fever or chills.   You have pelvic pain.   You have abnormal, yellow-colored, or bad-smelling vaginal discharge. This could be a sign of infection.   You have severe pain or cramps in your lower abdomen that do not get better with medicine.   You feel light-headed or dizzy, or you faint.  Summary   If you had a colposcopy without a biopsy, you can expect to feel fine right away, but you may have some spotting for a few days. You can go back to your normal activities.   If you had a colposcopy with a biopsy, you may notice mild pain and spotting for 48 hours after the procedure.   Avoid douching, using tampons, and having sexual intercourse for 3 days after the procedure or as long as told by your health care provider.   Contact your health care provider if you have bleeding, severe pain, or signs of infection.  This information is not intended to replace advice given to you by your health care provider. Make sure you discuss any questions you have with your   health care provider.  Document Released: 02/04/2013 Document Revised: 12/02/2015 Document Reviewed: 12/02/2015  Elsevier Interactive Patient Education  2019 Elsevier Inc.

## 2018-06-18 NOTE — Progress Notes (Signed)
  Subjective:     Patient ID: Leslie Webb, female   DOB: 1945/10/28, 73 y.o.   MRN: [id number]  HPI  Patient is here today for colposcopy with pap 06-02-18 showing Neg pap:Pos HR HPV.  Patient complaining of vulvar itching. No internal itching.  Thinks she may have scratched too hard and made herself bleed.  Wearing panty liners a lot due to stress incontinence.  No recent abx.   Pap history:  Pap: 05-31-17 LGSIL:Pos HR HPV.  Colposcopy 06/26/17 - benign.  Pap:05/23/16 Colposcopy showedlow grade dysplasia(VAIN I).04-25-16 ASCUS:Pos HR HPV12-20-16 LSIL:Pos HR HPV with colposcopy revealing LGSIL (VAIN I).Pap 04-09-14 LGSIL:Pos HR HPV;colposcopy of vaginal cuff biopsy normal. Hx of cryotherapy to cervix 1985.   Review of Systems  All other systems reviewed and are negative.   LMP: Hysterectomy Contraception: Hysterectomy     Objective:   Physical Exam Genitourinary:        Colposcopy - Vagina and vulva.  Consent for procedure.  3% acetic acid used in vagina and on vulva. White light and green light filter used.  Colposcopy satisfactory:   Visualization adequate.  Cervix is absent. Findings:    Cervix:  Absent. Vagina:  No acetowhite lesions.   Lugol's placed in the vagina.  Decreased uptake on right vaginal wall, possible atrophy in this location.   Vulva:   No acetowhite lesions. Biopsies:   Right vaginal wall.   Tissue to pathology.  Silver nitrate used at biopsy site. Minimal EBL. No complications.      Assessment:     Status post hysterectomy.  Hx VAIN I.     Plan:     FU biopsies.  We discussed HPV and its oncogenic potential for the head and neck, urogenital organs, and the anorectal region.  I anticipate yearly pap and colposcopies as indicated.  After visit summary to patient.

## 2018-07-14 DIAGNOSIS — E559 Vitamin D deficiency, unspecified: Secondary | ICD-10-CM | POA: Diagnosis not present

## 2018-11-11 DIAGNOSIS — E039 Hypothyroidism, unspecified: Secondary | ICD-10-CM | POA: Diagnosis not present

## 2018-11-11 DIAGNOSIS — I10 Essential (primary) hypertension: Secondary | ICD-10-CM | POA: Diagnosis not present

## 2018-11-11 DIAGNOSIS — Z0000 Encounter for general adult medical examination without abnormal findings: Secondary | ICD-10-CM | POA: Diagnosis not present

## 2018-11-11 DIAGNOSIS — E559 Vitamin D deficiency, unspecified: Secondary | ICD-10-CM | POA: Diagnosis not present

## 2018-11-11 DIAGNOSIS — E7800 Pure hypercholesterolemia, unspecified: Secondary | ICD-10-CM | POA: Diagnosis not present

## 2018-11-18 DIAGNOSIS — Z780 Asymptomatic menopausal state: Secondary | ICD-10-CM | POA: Diagnosis not present

## 2018-11-18 DIAGNOSIS — K219 Gastro-esophageal reflux disease without esophagitis: Secondary | ICD-10-CM | POA: Diagnosis not present

## 2018-11-18 DIAGNOSIS — E119 Type 2 diabetes mellitus without complications: Secondary | ICD-10-CM | POA: Diagnosis not present

## 2018-11-18 DIAGNOSIS — I10 Essential (primary) hypertension: Secondary | ICD-10-CM | POA: Diagnosis not present

## 2018-11-18 DIAGNOSIS — R002 Palpitations: Secondary | ICD-10-CM | POA: Diagnosis not present

## 2018-11-18 DIAGNOSIS — E7800 Pure hypercholesterolemia, unspecified: Secondary | ICD-10-CM | POA: Diagnosis not present

## 2018-11-18 DIAGNOSIS — E559 Vitamin D deficiency, unspecified: Secondary | ICD-10-CM | POA: Diagnosis not present

## 2018-11-18 DIAGNOSIS — E039 Hypothyroidism, unspecified: Secondary | ICD-10-CM | POA: Diagnosis not present

## 2018-11-18 DIAGNOSIS — R609 Edema, unspecified: Secondary | ICD-10-CM | POA: Diagnosis not present

## 2018-11-18 DIAGNOSIS — R109 Unspecified abdominal pain: Secondary | ICD-10-CM | POA: Diagnosis not present

## 2018-12-26 DIAGNOSIS — Z23 Encounter for immunization: Secondary | ICD-10-CM | POA: Diagnosis not present

## 2019-01-12 ENCOUNTER — Other Ambulatory Visit: Payer: Self-pay | Admitting: Obstetrics and Gynecology

## 2019-01-12 DIAGNOSIS — Z1231 Encounter for screening mammogram for malignant neoplasm of breast: Secondary | ICD-10-CM

## 2019-01-15 ENCOUNTER — Other Ambulatory Visit: Payer: Self-pay | Admitting: Internal Medicine

## 2019-01-15 DIAGNOSIS — Z780 Asymptomatic menopausal state: Secondary | ICD-10-CM

## 2019-02-04 ENCOUNTER — Other Ambulatory Visit: Payer: Self-pay

## 2019-02-04 ENCOUNTER — Ambulatory Visit
Admission: RE | Admit: 2019-02-04 | Discharge: 2019-02-04 | Disposition: A | Discharge status: Home / Self Care | Payer: Medicare Other | Source: Ambulatory Visit | Attending: Internal Medicine | Admitting: Internal Medicine

## 2019-02-04 DIAGNOSIS — Z13820 Encounter for screening for osteoporosis: Secondary | ICD-10-CM | POA: Diagnosis not present

## 2019-02-04 DIAGNOSIS — Z20822 Contact with and (suspected) exposure to covid-19: Secondary | ICD-10-CM

## 2019-02-04 DIAGNOSIS — Z780 Asymptomatic menopausal state: Secondary | ICD-10-CM

## 2019-02-06 LAB — NOVEL CORONAVIRUS, NAA: SARS-CoV-2, NAA: NOT DETECTED

## 2019-02-25 ENCOUNTER — Ambulatory Visit
Admission: RE | Admit: 2019-02-25 | Discharge: 2019-02-25 | Disposition: A | Discharge status: Home / Self Care | Payer: Medicare Other | Source: Ambulatory Visit | Attending: Obstetrics and Gynecology | Admitting: Obstetrics and Gynecology

## 2019-02-25 ENCOUNTER — Other Ambulatory Visit: Payer: Self-pay

## 2019-02-25 DIAGNOSIS — Z1231 Encounter for screening mammogram for malignant neoplasm of breast: Secondary | ICD-10-CM | POA: Diagnosis not present

## 2019-05-12 ENCOUNTER — Telehealth: Payer: Self-pay | Admitting: Obstetrics and Gynecology

## 2019-05-12 NOTE — Telephone Encounter (Signed)
Called regarding cancelled appointment because of provider not being in office. No answer.

## 2019-05-14 DIAGNOSIS — E7800 Pure hypercholesterolemia, unspecified: Secondary | ICD-10-CM | POA: Diagnosis not present

## 2019-05-14 DIAGNOSIS — I10 Essential (primary) hypertension: Secondary | ICD-10-CM | POA: Diagnosis not present

## 2019-05-14 DIAGNOSIS — E559 Vitamin D deficiency, unspecified: Secondary | ICD-10-CM | POA: Diagnosis not present

## 2019-05-14 DIAGNOSIS — Z0000 Encounter for general adult medical examination without abnormal findings: Secondary | ICD-10-CM | POA: Diagnosis not present

## 2019-05-21 DIAGNOSIS — E7800 Pure hypercholesterolemia, unspecified: Secondary | ICD-10-CM | POA: Diagnosis not present

## 2019-05-21 DIAGNOSIS — Z0000 Encounter for general adult medical examination without abnormal findings: Secondary | ICD-10-CM | POA: Diagnosis not present

## 2019-05-21 DIAGNOSIS — I10 Essential (primary) hypertension: Secondary | ICD-10-CM | POA: Diagnosis not present

## 2019-05-21 DIAGNOSIS — E559 Vitamin D deficiency, unspecified: Secondary | ICD-10-CM | POA: Diagnosis not present

## 2019-06-04 NOTE — Progress Notes (Signed)
74 y.o. G103P0020 Single African American female here for annual exam.    Stopped vit D due to elevated level.  Just received her first Covid vaccine.   PCP: Jani Gravel, MD  Patient's last menstrual period was 01/29/1984 (within months).           Sexually active: No.  The current method of family planning is status post hysterectomy.    Exercising: No.  The patient does not participate in regular exercise at present. Smoker:  no  Health Maintenance: Pap: 06-02-18 Neg:Pos HR HPV,05-31-17 LGSIL:Pos HR HPV  History of abnormal Pap:  Yes, 06-02-18 Neg:Pos HR HPV--colpo showed atrophy,05-31-17 LGSIL:Pos HR HPV--colpo neg., 04-25-16 ASCUS:Pos HR HPV--colpo showed VAIN I, 04-09-14 LGSIL:Pos HR HPV--colpo normal of vagina. Hx of cryotherapy to cervix 1985. MMG: 02-25-19 3D/Neg/density B/Birads1 Colonoscopy: 09/2014 diverticulosis with Dr. Lenise Herald due 09/2019 BMD: 02-04-19  Result :Normal--significant decrease in BMD of Lt.hip since prior exam 11-10-06 TDaP:  2013 Gardasil:   no HIV:no Hep C:no Screening Labs:  PCP.   reports that she has never smoked. She has never used smokeless tobacco. She reports current alcohol use of about 1.0 standard drinks of alcohol per week. She reports that she does not use drugs.  Past Medical History:  Diagnosis Date  . Anemia   . Breast calcifications   . Fibroid   . Genital warts   . History of colon polyps   . Hypercholesterolemia   . Hypertension   . Lichen simplex chronicus 11/2007   --vulva  . Migraine   . Obesity   . STD (sexually transmitted disease)    genital warts  . Vitamin D deficiency     Past Surgical History:  Procedure Laterality Date  . ABDOMINAL HYSTERECTOMY     TAH/BSO age 64  . BREAST BIOPSY Right    benign  . BREAST SURGERY     Rt. breast Bx--benign  . MYOMECTOMY     -age 15    Current Outpatient Medications  Medication Sig Dispense Refill  . aspirin 81 MG tablet Take 81 mg by mouth daily.    . Coenzyme Q10 (CO Q-10) 300  MG CAPS Take by mouth daily.    . furosemide (LASIX) 20 MG tablet Take 20 mg by mouth daily.    Marland Kitchen KLOR-CON M20 20 MEQ tablet Take 1 tablet by mouth daily.    Marland Kitchen losartan (COZAAR) 25 MG tablet TK 1 T PO D  1  . simvastatin (ZOCOR) 10 MG tablet Take 1 tablet by mouth daily.  6  . verapamil (VERELAN PM) 240 MG 24 hr capsule Take 240 mg by mouth at bedtime.     No current facility-administered medications for this visit.    Family History  Problem Relation Age of Onset  . Breast cancer Sister 53       Stage III  . Cancer Sister 84       Lung ca  . Diabetes Mother   . Hypertension Mother   . Prostate cancer Father        deceased age 37  . Hypertension Brother   . Cancer Brother        neck cancer  . Hypertension Maternal Grandmother   . Pancreatic cancer Paternal Grandmother     Review of Systems  All other systems reviewed and are negative.   Exam:   BP 140/84 (Cuff Size: Large)   Pulse 60   Temp (!) 97.2 F (36.2 C) (Temporal)   Resp 14   Ht  5' 7.75" (1.721 m)   Wt 283 lb 9.6 oz (128.6 kg)   LMP 01/29/1984 (Within Months)   BMI 43.44 kg/m     General appearance: alert, cooperative and appears stated age Head: normocephalic, without obvious abnormality, atraumatic Neck: no adenopathy, supple, symmetrical, trachea midline and thyroid normal to inspection and palpation Lungs: clear to auscultation bilaterally Breasts: normal appearance, no masses or tenderness, No nipple retraction or dimpling, No nipple discharge or bleeding, No axillary adenopathy Heart: regular rate and rhythm Abdomen: soft, non-tender; no masses, no organomegaly Extremities: extremities normal, atraumatic, no cyanosis or edema Skin: skin color, texture, turgor normal. No rashes or lesions.  Sebaceous cysts in left axillar and vulvar region. Lymph nodes: cervical, supraclavicular, and axillary nodes normal. Neurologic: grossly normal  Pelvic: External genitalia:  no lesions              No abnormal  inguinal nodes palpated.              Urethra:  normal appearing urethra with no masses, tenderness or lesions              Bartholins and Skenes: normal                 Vagina: normal appearing vagina with normal color and discharge, no lesions              Cervix: absent              Pap taken: Yes.   Bimanual Exam:  Uterus:  absent              Adnexa: no mass, fullness, tenderness              Rectal exam: Yes.  .  Confirms.              Anus:  normal sphincter tone, no lesions  Chaperone was present for exam.  Assessment:   Well woman visit with normal exam. Status post TAH/BSO.  Hx LGSIL of vaginal cuff. Positive HPV HPV. Hx lichen simplex chronicus.  FH of breast cancer.Sister. Hx irregular heart rhythm. Long standing.   Plan: Mammogram screening discussed. Self breast awareness reviewed. Pap and HR HPV as above. Guidelines for Calcium, Vitamin D, regular exercise program including cardiovascular and weight bearing exercise. We discussed HR HPV.  Follow up annually and prn.   After visit summary provided.

## 2019-06-05 ENCOUNTER — Other Ambulatory Visit: Payer: Self-pay

## 2019-06-05 ENCOUNTER — Ambulatory Visit: Payer: Medicare Other | Admitting: Obstetrics and Gynecology

## 2019-06-08 ENCOUNTER — Other Ambulatory Visit (HOSPITAL_COMMUNITY)
Admission: RE | Admit: 2019-06-08 | Discharge: 2019-06-08 | Disposition: A | Discharge status: Home / Self Care | Payer: Medicare Other | Source: Ambulatory Visit | Attending: Obstetrics and Gynecology | Admitting: Obstetrics and Gynecology

## 2019-06-08 ENCOUNTER — Other Ambulatory Visit: Payer: Self-pay

## 2019-06-08 ENCOUNTER — Ambulatory Visit (INDEPENDENT_AMBULATORY_CARE_PROVIDER_SITE_OTHER): Payer: Medicare Other | Admitting: Obstetrics and Gynecology

## 2019-06-08 ENCOUNTER — Encounter: Payer: Self-pay | Admitting: Obstetrics and Gynecology

## 2019-06-08 VITALS — BP 140/84 | HR 60 | Temp 97.2°F | Resp 14 | Ht 67.75 in | Wt 283.6 lb

## 2019-06-08 DIAGNOSIS — R87622 Low grade squamous intraepithelial lesion on cytologic smear of vagina (LGSIL): Secondary | ICD-10-CM | POA: Insufficient documentation

## 2019-06-08 DIAGNOSIS — Z90710 Acquired absence of both cervix and uterus: Secondary | ICD-10-CM | POA: Diagnosis not present

## 2019-06-08 DIAGNOSIS — Z01419 Encounter for gynecological examination (general) (routine) without abnormal findings: Secondary | ICD-10-CM | POA: Insufficient documentation

## 2019-06-08 DIAGNOSIS — R87811 Vaginal high risk human papillomavirus (HPV) DNA test positive: Secondary | ICD-10-CM

## 2019-06-08 NOTE — Patient Instructions (Signed)

## 2019-06-09 LAB — CYTOLOGY - PAP
Comment: NEGATIVE
High risk HPV: POSITIVE — AB

## 2019-06-12 ENCOUNTER — Other Ambulatory Visit: Payer: Self-pay | Admitting: *Deleted

## 2019-06-12 DIAGNOSIS — B977 Papillomavirus as the cause of diseases classified elsewhere: Secondary | ICD-10-CM

## 2019-06-12 DIAGNOSIS — R87622 Low grade squamous intraepithelial lesion on cytologic smear of vagina (LGSIL): Secondary | ICD-10-CM

## 2019-06-17 ENCOUNTER — Telehealth: Payer: Self-pay | Admitting: Obstetrics and Gynecology

## 2019-06-17 NOTE — Telephone Encounter (Signed)
Patient returned my call and I conveyed the benefits. Patient understands/agreeable with the benefits. Patient is aware pf the cancellation policy. Appointment scheduled 06/30/19.

## 2019-06-17 NOTE — Telephone Encounter (Signed)
Call placed to convey benefits for colposcopy. °

## 2019-06-29 NOTE — Progress Notes (Signed)
  Subjective:     Patient ID: Leslie Webb, female   DOB: 01-Jun-1945, 74 y.o.   MRN: [id number]  HPI  Patient here today for colposcopy with pap 06-08-19 LGSIL:Pos HR HPV.  Pap history: 06-02-18 Neg:Pos HR HPV--colpo showed atrophy,05-31-17 LGSIL:Pos HR HPV--colpo neg., 04-25-16 ASCUS:Pos HR HPV--colpo showed VAIN I, 04-09-14 LGSIL:Pos HR HPV--colpo normal of vagina. Hx of cryotherapy to cervix 1985.   Review of Systems  All other systems reviewed and are negative.   LMP: Hyst     Objective:   Physical Exam Genitourinary:     Colposcopy - vagina  Consent for procedure.  3% acetic acid used in vagina. White light and green light filter used.  Findings:    Cervix:  Absent. Vagina: midline apex with pale mucosa. A biopsy was taken here and sent to pathology.  Monsel's placed.  Minimal EBL. No complications.      Assessment:     Hx VAIN I.  Hx positive HR HPV.     Plan:     Fu biopsy result.  Post procedure precautions given. We discussed treatment options for high grade vaginal dysplasia - Effudex and laser.  I anticipate pap and HR HPV in one year.

## 2019-06-30 ENCOUNTER — Other Ambulatory Visit: Payer: Self-pay

## 2019-06-30 ENCOUNTER — Other Ambulatory Visit (HOSPITAL_COMMUNITY)
Admission: RE | Admit: 2019-06-30 | Discharge: 2019-06-30 | Disposition: A | Discharge status: Home / Self Care | Payer: Medicare Other | Source: Ambulatory Visit | Attending: Obstetrics and Gynecology | Admitting: Obstetrics and Gynecology

## 2019-06-30 ENCOUNTER — Encounter: Payer: Self-pay | Admitting: Obstetrics and Gynecology

## 2019-06-30 ENCOUNTER — Ambulatory Visit (INDEPENDENT_AMBULATORY_CARE_PROVIDER_SITE_OTHER): Payer: Medicare Other | Admitting: Obstetrics and Gynecology

## 2019-06-30 DIAGNOSIS — B977 Papillomavirus as the cause of diseases classified elsewhere: Secondary | ICD-10-CM

## 2019-06-30 DIAGNOSIS — R87622 Low grade squamous intraepithelial lesion on cytologic smear of vagina (LGSIL): Secondary | ICD-10-CM | POA: Diagnosis not present

## 2019-06-30 DIAGNOSIS — N890 Mild vaginal dysplasia: Secondary | ICD-10-CM | POA: Diagnosis not present

## 2019-06-30 NOTE — Patient Instructions (Signed)
Colposcopy, Care After This sheet gives you information about how to care for yourself after your procedure. Your doctor may also give you more specific instructions. If you have problems or questions, contact your doctor. What can I expect after the procedure? If you did not have a tissue sample removed (did not have a biopsy), you may only have some spotting for a few days. You can go back to your normal activities. If you had a tissue sample removed, it is common to have:  Soreness and pain. This may last for a few days.  Light-headedness.  Mild bleeding from your vagina or dark-colored, grainy discharge from your vagina. This may last for a few days. You may need to wear a sanitary pad.  Spotting for at least 48 hours after the procedure. Follow these instructions at home:   Take over-the-counter and prescription medicines only as told by your doctor. Ask your doctor what medicines you can start taking again. This is very important if you take blood-thinning medicine.  Do not drive or use heavy machinery while taking prescription pain medicine.  For 3 days, or as long as your doctor tells you, avoid: ? Douching. ? Using tampons. ? Having sex.  If you use birth control (contraception), keep using it.  Limit activity for the first day after the procedure. Ask your doctor what activities are safe for you.  It is up to you to get the results of your procedure. Ask your doctor when your results will be ready.  Keep all follow-up visits as told by your doctor. This is important. Contact a doctor if:  You get a skin rash. Get help right away if:  You are bleeding a lot from your vagina. It is a lot of bleeding if you are using more than one pad an hour for 2 hours in a row.  You have clumps of blood (blood clots) coming from your vagina.  You have a fever.  You have chills  You have pain in your lower belly (pelvic area).  You have signs of infection, such as vaginal  discharge that is: ? Different than usual. ? Yellow. ? Bad-smelling.  You have very pain or cramps in your lower belly that do not get better with medicine.  You feel light-headed.  You feel dizzy.  You pass out (faint). Summary  If you did not have a tissue sample removed (did not have a biopsy), you may only have some spotting for a few days. You can go back to your normal activities.  If you had a tissue sample removed, it is common to have mild pain and spotting for 48 hours.  For 3 days, or as long as your doctor tells you, avoid douching, using tampons and having sex.  Get help right away if you have bleeding, very bad pain, or signs of infection. This information is not intended to replace advice given to you by your health care provider. Make sure you discuss any questions you have with your health care provider. Document Revised: 03/29/2017 Document Reviewed: 01/04/2016 Elsevier Patient Education  2020 Elsevier Inc.  

## 2019-07-02 LAB — SURGICAL PATHOLOGY

## 2019-09-16 IMAGING — CT CT ABD-PELV W/ CM
2 of 5 series · 12 of 46 positions shown, 14 images · IV contrast (iopamidol)
Comparison: None.

CLINICAL DATA: Intermittent epigastric pain with nausea as well as
lower pelvic pain and constipation.

EXAM:
CT ABDOMEN AND PELVIS WITH CONTRAST
TECHNIQUE: Multidetector CT imaging of the abdomen and pelvis was performed
using the standard protocol following bolus administration of
intravenous contrast.
CONTRAST:  100mL [id number] IOPAMIDOL ([id number]) INJECTION 61%

[Series 2: abd pelvis 5.00 br40 s3 ax · axial · 0.66mm/px · z∈[+1420,+1825]mm · 9 of 103 slices shown, 11 images]
[im 11/103  soft-tissue]
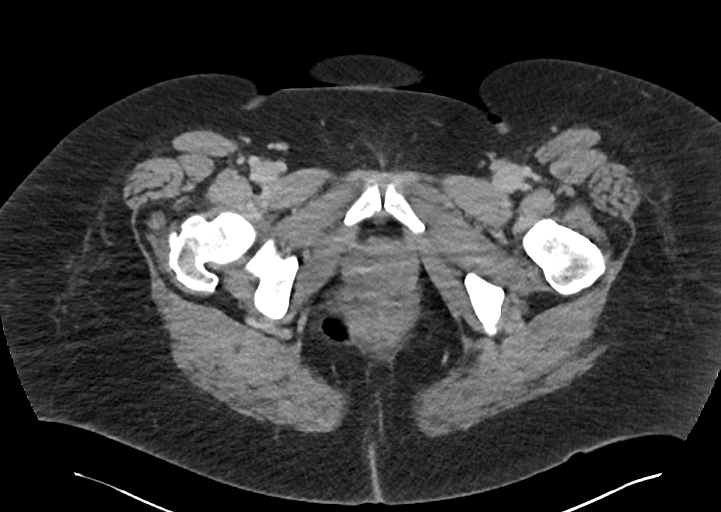
[im 11/103  bone]
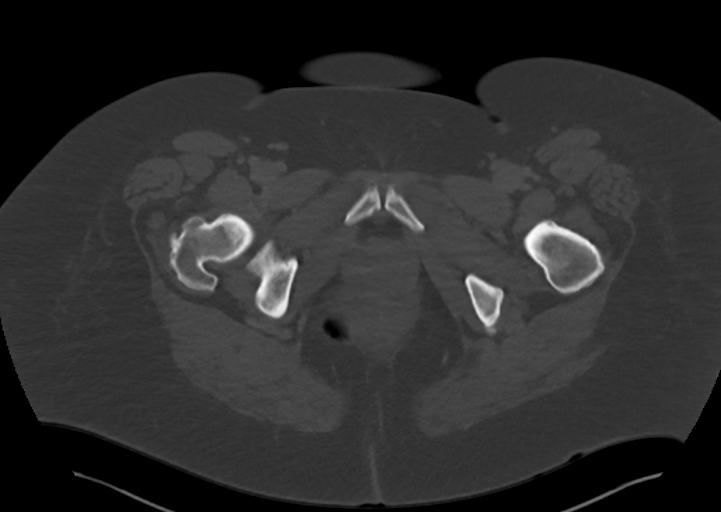
[im 21/103  soft-tissue]
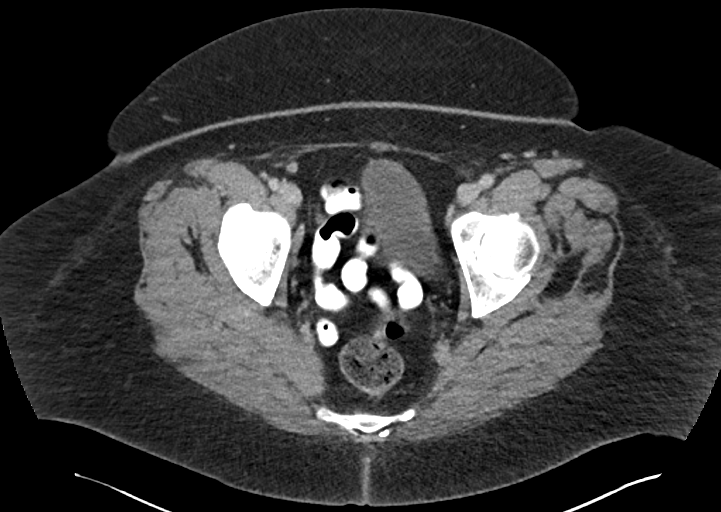
[im 31/103  soft-tissue]
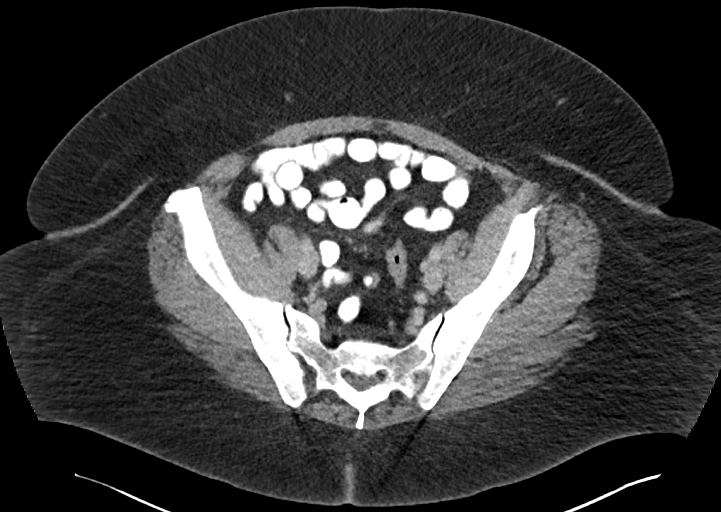
[im 41/103  soft-tissue]
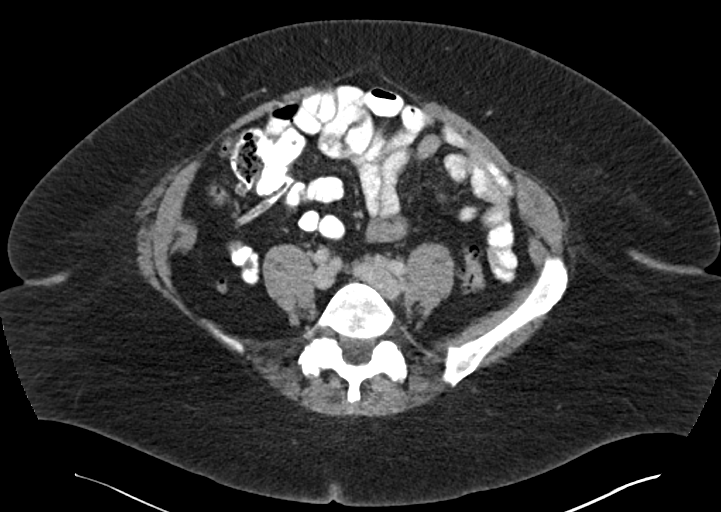
[im 52/103  soft-tissue]
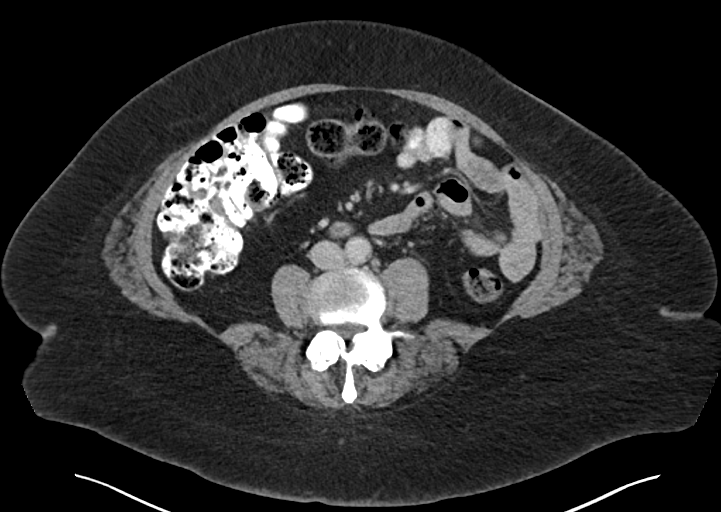
[im 62/103  soft-tissue]
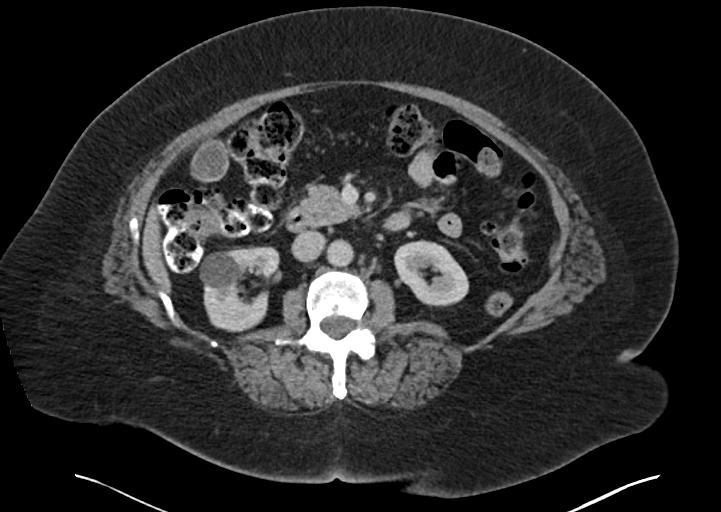
[im 72/103  soft-tissue]
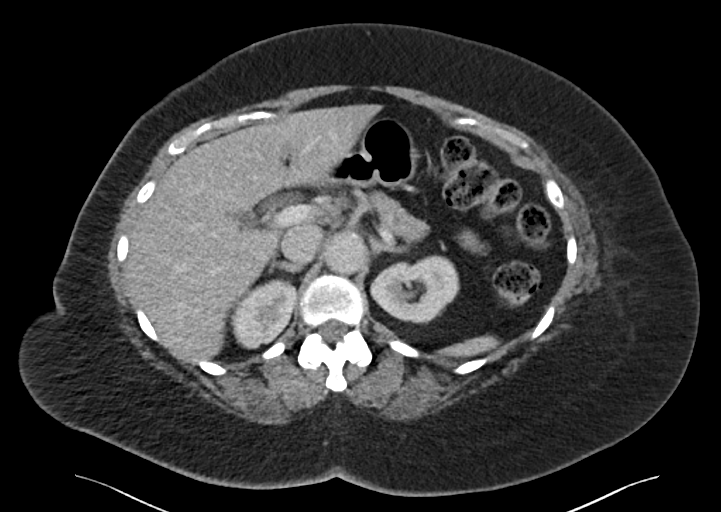
[im 82/103  soft-tissue]
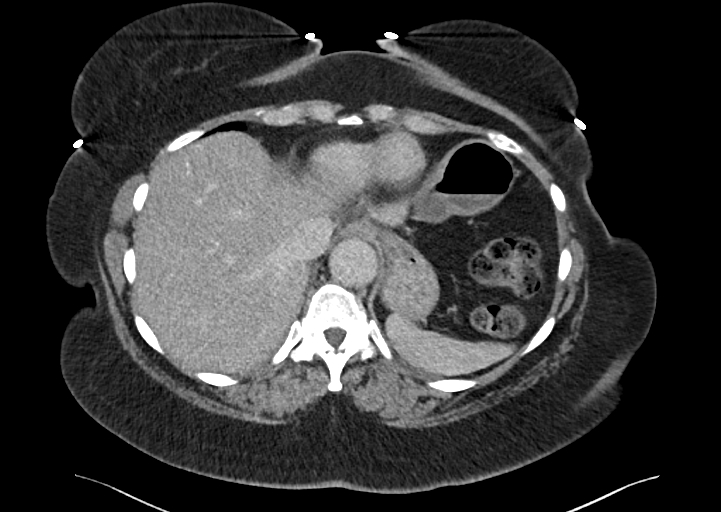
[im 92/103  soft-tissue]
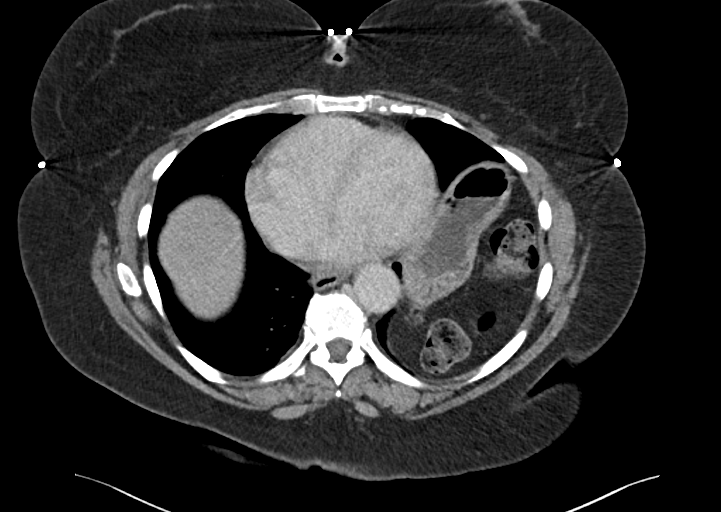
[im 92/103  bone]
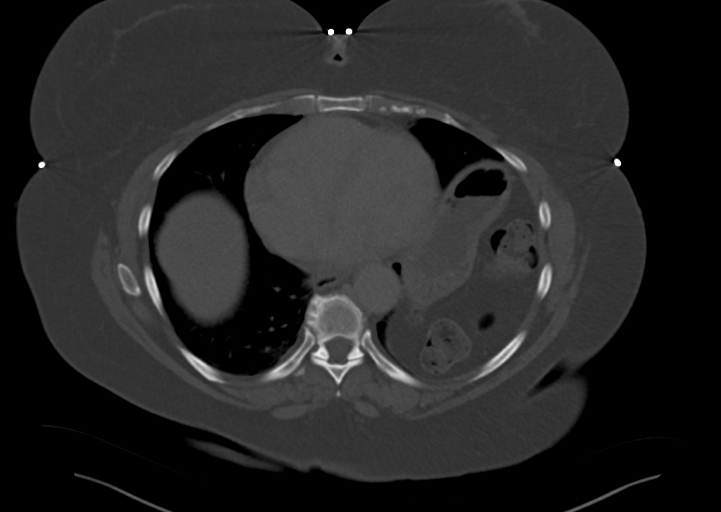

[Series 6: abd pelvis 2.00 br40 s3 cor · coronal · 0.94mm/px · 3 of 170 slices shown]
[im 57/170  soft-tissue]
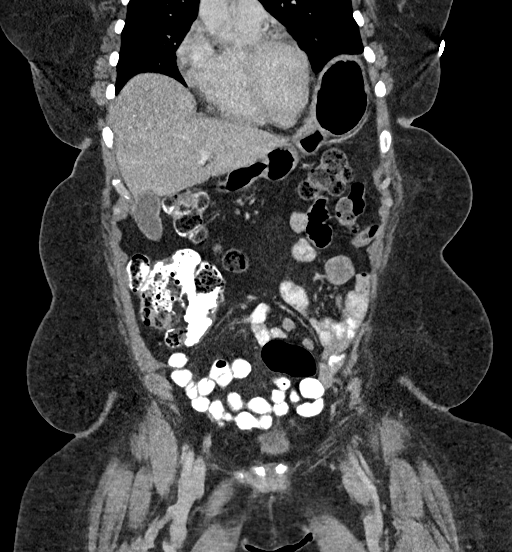
[im 76/170  soft-tissue]
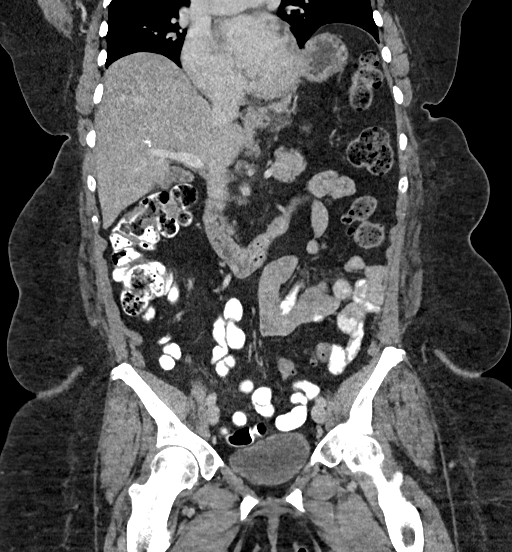
[im 94/170  soft-tissue]
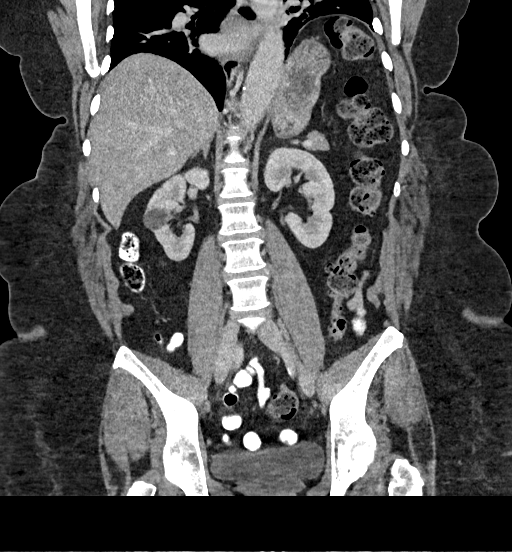

[12 of 46 positions shown; findings below may reference images not displayed]

FINDINGS: Lower chest: Slight elevation of the left hemidiaphragm.

Hepatobiliary: Mild cholelithiasis. 2.1 cm cyst over the right lobe
of the liver. Biliary tree is within normal. Linear distribution of
calcifications over the medial segment left lobe of the liver.

Pancreas: Normal.

Spleen: Normal.

Adrenals/Urinary Tract: Adrenal glands are normal. Kidneys are
normal in size without hydronephrosis or nephrolithiasis. 2.4 cm
cyst over the mid pole right kidney. Subcentimeter hypodensity over
the upper pole left renal cortex too small to characterize but
likely a cyst. Ureters and bladder are normal.

Stomach/Bowel: Stomach and small bowel are normal. Appendix is
normal. Mild diverticulosis of the colon.

Vascular/Lymphatic: Very minimal calcified plaque over the distal
abdominal aorta. No adenopathy.

Reproductive: Previous hysterectomy.  Adnexal regions are normal.

Other: No free fluid or focal inflammatory change.

Musculoskeletal: Minimal degenerative change of the spine and hips.
IMPRESSION: No acute findings in the abdomen/pelvis.

Cholelithiasis.

2.1 cm liver cyst.

2.4 cm right renal cyst and subcentimeter left renal cortical
hypodensity too small to characterize but likely a cyst.

Mild colonic diverticulosis.

Aortic Atherosclerosis ([id number]).

## 2019-11-11 DIAGNOSIS — I10 Essential (primary) hypertension: Secondary | ICD-10-CM | POA: Diagnosis not present

## 2019-11-11 DIAGNOSIS — E559 Vitamin D deficiency, unspecified: Secondary | ICD-10-CM | POA: Diagnosis not present

## 2019-11-18 DIAGNOSIS — R739 Hyperglycemia, unspecified: Secondary | ICD-10-CM | POA: Diagnosis not present

## 2019-11-18 DIAGNOSIS — Z1211 Encounter for screening for malignant neoplasm of colon: Secondary | ICD-10-CM | POA: Diagnosis not present

## 2019-11-18 DIAGNOSIS — L989 Disorder of the skin and subcutaneous tissue, unspecified: Secondary | ICD-10-CM | POA: Diagnosis not present

## 2019-11-18 DIAGNOSIS — Z780 Asymptomatic menopausal state: Secondary | ICD-10-CM | POA: Diagnosis not present

## 2019-11-18 DIAGNOSIS — R609 Edema, unspecified: Secondary | ICD-10-CM | POA: Diagnosis not present

## 2019-11-18 DIAGNOSIS — I10 Essential (primary) hypertension: Secondary | ICD-10-CM | POA: Diagnosis not present

## 2019-11-18 DIAGNOSIS — E039 Hypothyroidism, unspecified: Secondary | ICD-10-CM | POA: Diagnosis not present

## 2019-11-18 DIAGNOSIS — E7800 Pure hypercholesterolemia, unspecified: Secondary | ICD-10-CM | POA: Diagnosis not present

## 2019-11-18 DIAGNOSIS — K219 Gastro-esophageal reflux disease without esophagitis: Secondary | ICD-10-CM | POA: Diagnosis not present

## 2019-11-18 DIAGNOSIS — E559 Vitamin D deficiency, unspecified: Secondary | ICD-10-CM | POA: Diagnosis not present

## 2019-12-01 DIAGNOSIS — L821 Other seborrheic keratosis: Secondary | ICD-10-CM | POA: Diagnosis not present

## 2019-12-01 DIAGNOSIS — S20461A Insect bite (nonvenomous) of right back wall of thorax, initial encounter: Secondary | ICD-10-CM | POA: Diagnosis not present

## 2019-12-01 DIAGNOSIS — Z1211 Encounter for screening for malignant neoplasm of colon: Secondary | ICD-10-CM | POA: Diagnosis not present

## 2019-12-06 LAB — COLOGUARD: COLOGUARD: NEGATIVE

## 2020-01-26 ENCOUNTER — Other Ambulatory Visit: Payer: Self-pay | Admitting: Obstetrics and Gynecology

## 2020-01-26 DIAGNOSIS — Z1231 Encounter for screening mammogram for malignant neoplasm of breast: Secondary | ICD-10-CM

## 2020-02-12 DIAGNOSIS — Z23 Encounter for immunization: Secondary | ICD-10-CM | POA: Diagnosis not present

## 2020-02-29 ENCOUNTER — Other Ambulatory Visit: Payer: Self-pay

## 2020-02-29 ENCOUNTER — Ambulatory Visit
Admission: RE | Admit: 2020-02-29 | Discharge: 2020-02-29 | Disposition: A | Discharge status: Home / Self Care | Payer: Medicare Other | Source: Ambulatory Visit | Attending: Obstetrics and Gynecology | Admitting: Obstetrics and Gynecology

## 2020-02-29 DIAGNOSIS — Z1231 Encounter for screening mammogram for malignant neoplasm of breast: Secondary | ICD-10-CM

## 2020-03-12 ENCOUNTER — Ambulatory Visit: Payer: Medicare Other | Attending: Internal Medicine

## 2020-03-12 DIAGNOSIS — Z23 Encounter for immunization: Secondary | ICD-10-CM

## 2020-03-12 NOTE — Progress Notes (Signed)
° °  Covid-19 Vaccination Clinic  Name:  Leslie Webb    MRN: [id number] DOB: 06/13/1945  03/12/2020  [name] was observed post Covid-19 immunization for 15 minutes without incident. She was provided with Vaccine Information Sheet and instruction to access the V-Safe system.   [name] was instructed to call [phone] with any severe reactions post vaccine:  Difficulty breathing   Swelling of face and throat   A fast heartbeat   A bad rash all over body   Dizziness and weakness   Immunizations Administered    Name Date Dose VIS Date Route   Pfizer COVID-19 Vaccine 03/12/2020  2:49 PM 0.3 mL 02/17/2020 Intramuscular   Manufacturer: [place]   Lot: [place]   [place]: [id number]

## 2020-05-18 DIAGNOSIS — E559 Vitamin D deficiency, unspecified: Secondary | ICD-10-CM | POA: Diagnosis not present

## 2020-05-18 DIAGNOSIS — R946 Abnormal results of thyroid function studies: Secondary | ICD-10-CM | POA: Diagnosis not present

## 2020-05-18 DIAGNOSIS — I10 Essential (primary) hypertension: Secondary | ICD-10-CM | POA: Diagnosis not present

## 2020-05-18 DIAGNOSIS — R739 Hyperglycemia, unspecified: Secondary | ICD-10-CM | POA: Diagnosis not present

## 2020-05-18 DIAGNOSIS — R7309 Other abnormal glucose: Secondary | ICD-10-CM | POA: Diagnosis not present

## 2020-05-18 DIAGNOSIS — E7800 Pure hypercholesterolemia, unspecified: Secondary | ICD-10-CM | POA: Diagnosis not present

## 2020-05-25 DIAGNOSIS — E7800 Pure hypercholesterolemia, unspecified: Secondary | ICD-10-CM | POA: Diagnosis not present

## 2020-05-25 DIAGNOSIS — Z0000 Encounter for general adult medical examination without abnormal findings: Secondary | ICD-10-CM | POA: Diagnosis not present

## 2020-05-25 DIAGNOSIS — I10 Essential (primary) hypertension: Secondary | ICD-10-CM | POA: Diagnosis not present

## 2020-05-25 DIAGNOSIS — E559 Vitamin D deficiency, unspecified: Secondary | ICD-10-CM | POA: Diagnosis not present

## 2020-07-06 ENCOUNTER — Ambulatory Visit: Payer: Medicare Other | Admitting: Obstetrics and Gynecology

## 2020-09-06 NOTE — Progress Notes (Signed)
75 y.o. G11P0020 Single African American female here for breast and pelvic exam.   She is followed for VAIN I and positive HR HPV. No vaginal bleeding or pelvic pain.   Took hormone therapy for 10 years after her hysterectomy.  Patient has had AEX with PCP this year. Normal BMD 2020.  Received her Covid booster.  She is looking to do the second booster.   PCP:  Jani Gravel, MD     Patient's last menstrual period was 01/29/1984 (within months).           Sexually active: No.  The current method of family planning is status post hysterectomy.    Exercising: No.  The patient does not participate in regular exercise at present. Smoker:  no  Health Maintenance: Pap: 06-08-19 LSIL:Pos HR HPV, 06-02-18 Neg:Pos HR HPV,05-31-17 LGSIL:Pos HR HPV  History of abnormal Pap:  Yes, 06-30-19 Vag.colpo revealed LSIL;06-08-19 LSIL:Pos HR HPV.06-02-18 Neg:Pos HR HPV--colpo showed atrophy,05-31-17 LGSIL:Pos HR HPV--colpo neg., 04-25-16 ASCUS:Pos HR HPV--colpo showed VAIN I, 04-09-14 LGSIL:Pos HR HPV--colpo normal of vagina. Hx of cryotherapy to cervix 1985. MMG: 02-29-20 3D/Neg/BiRads1 Colonoscopy:  2021 cologuard Neg with PCP;09/2014 diverticulosis with Dr. Lenise Herald due 09/2019 BMD: 02-04-19 Result :Normal--significant decrease in BMD of Lt.hip since prior exam 11-10-06 TDaP:  2013 Gardasil:   no HIV: no Hep C:no Screening Labs:  PCP   reports that she has never smoked. She has never used smokeless tobacco. She reports current alcohol use of about 1.0 standard drink of alcohol per week. She reports that she does not use drugs.  Past Medical History:  Diagnosis Date  . Anemia   . Breast calcifications   . Fibroid   . Genital warts   . History of colon polyps   . Hypercholesterolemia   . Hypertension   . Lichen simplex chronicus 11/2007   --vulva  . Migraine   . Obesity   . STD (sexually transmitted disease)    genital warts  . Vitamin D deficiency     Past Surgical History:  Procedure Laterality Date   . ABDOMINAL HYSTERECTOMY     TAH/BSO age 36  . BREAST BIOPSY Right    benign  . BREAST SURGERY     Rt. breast Bx--benign  . MYOMECTOMY     -age 56    Current Outpatient Medications  Medication Sig Dispense Refill  . aspirin 81 MG tablet Take 81 mg by mouth daily.    . cholecalciferol (VITAMIN D3) 25 MCG (1000 UNIT) tablet 5 capsules    . Coenzyme Q10 (CO Q-10) 300 MG CAPS Take by mouth daily.    . furosemide (LASIX) 20 MG tablet Take 20 mg by mouth daily.    Marland Kitchen KLOR-CON M20 20 MEQ tablet Take 1 tablet by mouth daily.    Marland Kitchen losartan (COZAAR) 25 MG tablet TK 1 T PO D  1  . simvastatin (ZOCOR) 10 MG tablet Take 1 tablet by mouth daily.  6  . verapamil (VERELAN PM) 240 MG 24 hr capsule Take 240 mg by mouth at bedtime.     No current facility-administered medications for this visit.    Family History  Problem Relation Age of Onset  . Breast cancer Sister 59       Stage III  . Cancer Sister 80       Lung ca  . Diabetes Mother   . Hypertension Mother   . Prostate cancer Father        deceased age 57  . Hypertension  Brother   . Cancer Brother        neck cancer  . Hypertension Maternal Grandmother   . Pancreatic cancer Paternal Grandmother     Review of Systems  All other systems reviewed and are negative.   Exam:   BP (!) 148/86 (Cuff Size: Large)   Pulse 76   Ht 5\' 7"  (1.702 m)   Wt 278 lb (126.1 kg)   LMP 01/29/1984 (Within Months)   SpO2 98%   BMI 43.54 kg/m     General appearance: alert, cooperative and appears stated age Lungs: clear to auscultation bilaterally Breasts: normal appearance, no masses or tenderness, No nipple retraction or dimpling, No nipple discharge or bleeding, No axillary adenopathy Heart: regular rate and rhythm Abdomen: soft, non-tender; no masses, no organomegaly Extremities: extremities normal, atraumatic, no cyanosis or edema Neurologic: grossly normal No inguinal lymph nodes palpable.  Pelvic: External genitalia:  no lesions               No abnormal inguinal nodes palpated.              Urethra:  normal appearing urethra with no masses, tenderness or lesions              Bartholins and Skenes: normal                 Vagina: normal appearing vagina with normal color and discharge, no lesions              Cervix: absent.  Atrophy noted with pap.               Pap taken: Yes.   Bimanual Exam:  Uterus:  absent              Adnexa: no mass, fullness, tenderness              Rectal exam: Yes.  .  Confirms.              Anus:  normal sphincter tone, no lesions  Chaperone was present for exam.  Assessment:   Status post TAH/BSO. LGSIL of vaginal cuff.  Positive HR HPV. Pelvic exam with abnormal finding of atrophy.  Hx lichen simplex chronicus.  Screening breast exam. FH breast cancer:  Sister. Rectal exam encounter.  Plan: Mammogram screening discussed. Self breast awareness reviewed. Pap and HR HPV as above. Guidelines for Calcium, Vitamin D, regular exercise program including cardiovascular and weight bearing exercise.  Follow up annually and prn.   20 min  total time was spent for this patient encounter, including preparation, face-to-face counseling with the patient, coordination of care, and documentation of the encounter.

## 2020-09-08 ENCOUNTER — Other Ambulatory Visit: Payer: Self-pay

## 2020-09-08 ENCOUNTER — Ambulatory Visit (INDEPENDENT_AMBULATORY_CARE_PROVIDER_SITE_OTHER): Payer: Medicare Other | Admitting: Obstetrics and Gynecology

## 2020-09-08 ENCOUNTER — Encounter: Payer: Self-pay | Admitting: Obstetrics and Gynecology

## 2020-09-08 ENCOUNTER — Other Ambulatory Visit (HOSPITAL_COMMUNITY)
Admission: RE | Admit: 2020-09-08 | Discharge: 2020-09-08 | Disposition: A | Discharge status: Home / Self Care | Payer: Medicare Other | Source: Ambulatory Visit | Attending: Obstetrics and Gynecology | Admitting: Obstetrics and Gynecology

## 2020-09-08 VITALS — BP 148/86 | HR 76 | Ht 67.0 in | Wt 278.0 lb

## 2020-09-08 DIAGNOSIS — Z1151 Encounter for screening for human papillomavirus (HPV): Secondary | ICD-10-CM | POA: Insufficient documentation

## 2020-09-08 DIAGNOSIS — R87622 Low grade squamous intraepithelial lesion on cytologic smear of vagina (LGSIL): Secondary | ICD-10-CM

## 2020-09-08 DIAGNOSIS — Z01411 Encounter for gynecological examination (general) (routine) with abnormal findings: Secondary | ICD-10-CM

## 2020-09-08 DIAGNOSIS — Z008 Encounter for other general examination: Secondary | ICD-10-CM

## 2020-09-08 DIAGNOSIS — Z9189 Other specified personal risk factors, not elsewhere classified: Secondary | ICD-10-CM | POA: Diagnosis not present

## 2020-09-08 DIAGNOSIS — R87811 Vaginal high risk human papillomavirus (HPV) DNA test positive: Secondary | ICD-10-CM

## 2020-09-08 DIAGNOSIS — Z1239 Encounter for other screening for malignant neoplasm of breast: Secondary | ICD-10-CM

## 2020-09-08 DIAGNOSIS — N952 Postmenopausal atrophic vaginitis: Secondary | ICD-10-CM

## 2020-09-08 NOTE — Patient Instructions (Signed)

## 2020-09-12 DIAGNOSIS — Z23 Encounter for immunization: Secondary | ICD-10-CM | POA: Diagnosis not present

## 2020-09-12 DIAGNOSIS — Z20822 Contact with and (suspected) exposure to covid-19: Secondary | ICD-10-CM | POA: Diagnosis not present

## 2020-09-12 LAB — CYTOLOGY - PAP
Comment: NEGATIVE
Diagnosis: UNDETERMINED — AB
High risk HPV: POSITIVE — AB

## 2020-09-15 ENCOUNTER — Other Ambulatory Visit: Payer: Self-pay | Admitting: *Deleted

## 2020-09-15 DIAGNOSIS — R87610 Atypical squamous cells of undetermined significance on cytologic smear of cervix (ASC-US): Secondary | ICD-10-CM

## 2020-09-29 NOTE — Progress Notes (Signed)
  Subjective:     Patient ID: Leslie Webb, female   DOB: 1945-12-19, 75 y.o.   MRN: [id number]  HPI Patient here for Colposcopy.  Pap smear 09/08/2020 results: ASCUS and Positive HR HPV   Pap smear history  06/08/2019 LSIL: HR HPV POSITIVE.  Colposcopy 06/30/19 - LGSIL.  06/02/2018 NEG/ HR HPV POSITVE  Patient's last menstrual period was 01/29/1984 (within months).     The current method of family planning is status post hysterectomy.        Objective:   Physical Exam Genitourinary:     Colposcopy - vagina. Consent for procedure.  Acetic acid used in vagina. White light and green light filter used.  Findings:    Cervix:  absent Vagina:  no acetowhite lesions noted. Lugols solution placed.  Small scattered areas of decreased Lugol's uptake at vaginal apex.   Biopsy left apex and right apex, sent to pathology separately. Monsel's placed.  Minimal EBL. No complications.      Assessment:     ASCUS pap, positive HR HPV.  Hx VAIN I.     Plan:     Post procedure precautions given.  Fu biopsy results. Anticipate next pap and HR HPV in one year.  Excision, laser ablation, and Effudex discussed for treating moderate/high grade vaginal dysplasia.

## 2020-10-07 DIAGNOSIS — H43813 Vitreous degeneration, bilateral: Secondary | ICD-10-CM | POA: Diagnosis not present

## 2020-10-13 ENCOUNTER — Ambulatory Visit (INDEPENDENT_AMBULATORY_CARE_PROVIDER_SITE_OTHER): Payer: Medicare Other | Admitting: Obstetrics and Gynecology

## 2020-10-13 ENCOUNTER — Other Ambulatory Visit: Payer: Self-pay

## 2020-10-13 ENCOUNTER — Other Ambulatory Visit (HOSPITAL_COMMUNITY)
Admission: RE | Admit: 2020-10-13 | Discharge: 2020-10-13 | Disposition: A | Discharge status: Home / Self Care | Payer: Medicare Other | Source: Ambulatory Visit | Attending: Obstetrics and Gynecology | Admitting: Obstetrics and Gynecology

## 2020-10-13 ENCOUNTER — Encounter: Payer: Self-pay | Admitting: Obstetrics and Gynecology

## 2020-10-13 VITALS — BP 130/70 | HR 76

## 2020-10-13 DIAGNOSIS — R87610 Atypical squamous cells of undetermined significance on cytologic smear of cervix (ASC-US): Secondary | ICD-10-CM | POA: Insufficient documentation

## 2020-10-13 DIAGNOSIS — N890 Mild vaginal dysplasia: Secondary | ICD-10-CM | POA: Diagnosis not present

## 2020-10-13 DIAGNOSIS — R87810 Cervical high risk human papillomavirus (HPV) DNA test positive: Secondary | ICD-10-CM | POA: Diagnosis not present

## 2020-10-13 DIAGNOSIS — R87811 Vaginal high risk human papillomavirus (HPV) DNA test positive: Secondary | ICD-10-CM | POA: Diagnosis not present

## 2020-10-13 NOTE — Patient Instructions (Signed)
Colposcopy, Care After This sheet gives you information about how to care for yourself after your procedure. Your doctor may also give you more specific instructions. If youhave problems or questions, contact your doctor. What can I expect after the procedure? If you did not have a sample of your tissue taken out (did not have a biopsy), you may only have some spotting of blood for a few days. You can go back toyour normal activities. If you had a sample of your tissue taken out, it is common to have: Soreness and mild pain. These may last for a few days. A light-headed feeling. Mild bleeding or fluid (discharge) coming from your vagina. The fluid will look dark and grainy. You may have this for a few days. The fluid may be caused by a liquid that was used during your procedure. You may need to wear a sanitary pad. Spotting of blood for at least 48 hours after the procedure. Follow these instructions at home: Medicines Take over-the-counter and prescription medicines only as told by your doctor. Ask your doctor what medicines you can start taking again. This is very important if you take blood thinners. Activity Limit your activity for the first day after your procedure as told by your doctor. For at least 3 days, or for as long as told by your doctor, avoid: Douching. Using tampons. Having sex. Return to your normal activities as told by your doctor. Ask your doctor what activities are safe for you. General instructions  Drink enough fluid to keep your pee (urine) pale yellow. Ask your doctor if you may take baths, swim, or use a hot tub. You may take showers. If you use birth control (contraception), keep using it. Keep all follow-up visits as told by your doctor. This is important.  Contact a doctor if: You get a skin rash. Get help right away if: You bleed a lot from your vagina. A lot of bleeding means you use more than one pad an hour for 2 hours in a row. You have clumps of  blood (blood clots) coming from your vagina. You have a fever or chills. You have signs of infection. This may be fluid coming from your vagina that is: Different than normal. Yellow. Bad-smelling. You have very bad pain or cramps in your lower belly that do not get better with medicine. You faint. Summary If you did not have a sample of your tissue taken out, you may only have some spotting of blood for a few days. You can go back to your normal activities. If you had a sample of your tissue taken out, it is common to have mild pain for a few days and spotting for 48 hours. Avoid douching, using tampons, and having sex for at least 3 days after the procedure or for as long as told. Get help right away if you have a lot of bleeding, very bad pain, or signs of infection. This information is not intended to replace advice given to you by your health care provider. Make sure you discuss any questions you have with your healthcare provider. Document Revised: 02/16/2020 Document Reviewed: 04/15/2019 Elsevier Patient Education  2022 Elsevier Inc.  

## 2020-10-16 DIAGNOSIS — N890 Mild vaginal dysplasia: Secondary | ICD-10-CM | POA: Insufficient documentation

## 2020-10-16 DIAGNOSIS — R87811 Vaginal high risk human papillomavirus (HPV) DNA test positive: Secondary | ICD-10-CM | POA: Insufficient documentation

## 2020-10-17 LAB — SURGICAL PATHOLOGY

## 2020-11-16 DIAGNOSIS — I10 Essential (primary) hypertension: Secondary | ICD-10-CM | POA: Diagnosis not present

## 2020-11-16 DIAGNOSIS — E7800 Pure hypercholesterolemia, unspecified: Secondary | ICD-10-CM | POA: Diagnosis not present

## 2020-11-16 DIAGNOSIS — E559 Vitamin D deficiency, unspecified: Secondary | ICD-10-CM | POA: Diagnosis not present

## 2020-11-23 DIAGNOSIS — I493 Ventricular premature depolarization: Secondary | ICD-10-CM | POA: Diagnosis not present

## 2020-11-23 DIAGNOSIS — R946 Abnormal results of thyroid function studies: Secondary | ICD-10-CM | POA: Diagnosis not present

## 2020-11-23 DIAGNOSIS — Z13820 Encounter for screening for osteoporosis: Secondary | ICD-10-CM | POA: Diagnosis not present

## 2020-11-23 DIAGNOSIS — R7309 Other abnormal glucose: Secondary | ICD-10-CM | POA: Diagnosis not present

## 2020-11-23 DIAGNOSIS — I10 Essential (primary) hypertension: Secondary | ICD-10-CM | POA: Diagnosis not present

## 2020-11-23 DIAGNOSIS — E7800 Pure hypercholesterolemia, unspecified: Secondary | ICD-10-CM | POA: Diagnosis not present

## 2020-11-23 DIAGNOSIS — R609 Edema, unspecified: Secondary | ICD-10-CM | POA: Diagnosis not present

## 2020-11-23 DIAGNOSIS — E559 Vitamin D deficiency, unspecified: Secondary | ICD-10-CM | POA: Diagnosis not present

## 2020-11-23 DIAGNOSIS — R0989 Other specified symptoms and signs involving the circulatory and respiratory systems: Secondary | ICD-10-CM | POA: Diagnosis not present

## 2021-01-06 DIAGNOSIS — Z23 Encounter for immunization: Secondary | ICD-10-CM | POA: Diagnosis not present

## 2021-01-27 ENCOUNTER — Other Ambulatory Visit: Payer: Self-pay | Admitting: Obstetrics and Gynecology

## 2021-01-27 DIAGNOSIS — Z1231 Encounter for screening mammogram for malignant neoplasm of breast: Secondary | ICD-10-CM

## 2021-02-09 DIAGNOSIS — R300 Dysuria: Secondary | ICD-10-CM | POA: Diagnosis not present

## 2021-02-09 DIAGNOSIS — R350 Frequency of micturition: Secondary | ICD-10-CM | POA: Diagnosis not present

## 2021-02-09 DIAGNOSIS — R319 Hematuria, unspecified: Secondary | ICD-10-CM | POA: Diagnosis not present

## 2021-03-02 ENCOUNTER — Ambulatory Visit
Admission: RE | Admit: 2021-03-02 | Discharge: 2021-03-02 | Disposition: A | Discharge status: Home / Self Care | Payer: Medicare Other | Source: Ambulatory Visit | Attending: Obstetrics and Gynecology | Admitting: Obstetrics and Gynecology

## 2021-03-02 DIAGNOSIS — Z1231 Encounter for screening mammogram for malignant neoplasm of breast: Secondary | ICD-10-CM | POA: Diagnosis not present

## 2021-04-13 DIAGNOSIS — Z23 Encounter for immunization: Secondary | ICD-10-CM | POA: Diagnosis not present

## 2021-05-24 DIAGNOSIS — R7309 Other abnormal glucose: Secondary | ICD-10-CM | POA: Diagnosis not present

## 2021-05-24 DIAGNOSIS — I10 Essential (primary) hypertension: Secondary | ICD-10-CM | POA: Diagnosis not present

## 2021-05-24 DIAGNOSIS — E559 Vitamin D deficiency, unspecified: Secondary | ICD-10-CM | POA: Diagnosis not present

## 2021-05-24 DIAGNOSIS — Z780 Asymptomatic menopausal state: Secondary | ICD-10-CM | POA: Diagnosis not present

## 2021-05-24 DIAGNOSIS — R946 Abnormal results of thyroid function studies: Secondary | ICD-10-CM | POA: Diagnosis not present

## 2021-05-24 DIAGNOSIS — E7800 Pure hypercholesterolemia, unspecified: Secondary | ICD-10-CM | POA: Diagnosis not present

## 2021-05-30 DIAGNOSIS — R197 Diarrhea, unspecified: Secondary | ICD-10-CM | POA: Diagnosis not present

## 2021-05-31 DIAGNOSIS — I10 Essential (primary) hypertension: Secondary | ICD-10-CM | POA: Diagnosis not present

## 2021-05-31 DIAGNOSIS — Z23 Encounter for immunization: Secondary | ICD-10-CM | POA: Diagnosis not present

## 2021-05-31 DIAGNOSIS — Z0000 Encounter for general adult medical examination without abnormal findings: Secondary | ICD-10-CM | POA: Diagnosis not present

## 2021-05-31 DIAGNOSIS — E7800 Pure hypercholesterolemia, unspecified: Secondary | ICD-10-CM | POA: Diagnosis not present

## 2021-05-31 DIAGNOSIS — I493 Ventricular premature depolarization: Secondary | ICD-10-CM | POA: Diagnosis not present

## 2021-09-05 NOTE — Progress Notes (Signed)
76 y.o. G17P0020 Single African American female here for annual exam.  ? ?No GYN concerns.  ? ?She declines rectal exam today.  ? ?PCP:  Janie Morning, MD    ? ?Patient's last menstrual period was 01/29/1984 (within months).     ?  ?    ?Sexually active: No.  ?The current method of family planning is status post hysterectomy.    ?Exercising: No.     ?Smoker:  no ? ?Health Maintenance: ?Pap: 09/08/20-ASCUS, HPV+; 06/08/19-LGSIL, HPV+; 06/02/18-WNL, HPV+ ?History of abnormal Pap: yes, 09/08/20-ASCUS, HPV+; Colpo 10/13/20-VAIN 1; 06/08/19-LGSIL, HPV+; Colpo 06/30/19- VAIN 1; 06/02/18-WNL, HPV+; Colpo 06/18/18-atrophy; 05/31/17-LGSIL, HPV+; Colpo 06/26/17-WNL; 04/15/16-ASCUS, HPV+; Colpo 05/23/16-VAIN 1, 04/19/15-LGSIL,HPV+; Colpo 05/11/15-VAIN 1; 04/09/14- LGSIL, HPV+; Colpo 05/06/14-benign ?MMG: 03/02/21-neg birads 1 ?Colonoscopy: 2021 cologuard Neg with PCP ?BMD: 02/04/19  Result  WNL ?TDaP: 2013.  Thinks it was done in the last 4 - 5 years.  ?Gardasil: no ?HIV: no ?Hep C: no ?Screening Labs: PCP. ? ? reports that she has never smoked. She has never used smokeless tobacco. She reports current alcohol use of about 1.0 standard drink per week. She reports that she does not use drugs. ? ?Past Medical History:  ?Diagnosis Date  ? Anemia   ? Breast calcifications   ? Fibroid   ? Genital warts   ? History of colon polyps   ? Hypercholesterolemia   ? Hypertension   ? Lichen simplex chronicus 11/2007  ? --vulva  ? Migraine   ? Obesity   ? STD (sexually transmitted disease)   ? genital warts  ? Vitamin D deficiency   ? ? ?Past Surgical History:  ?Procedure Laterality Date  ? ABDOMINAL HYSTERECTOMY    ? TAH/BSO age 15  ? BREAST BIOPSY Right   ? benign  ? BREAST SURGERY    ? Rt. breast Bx--benign  ? MYOMECTOMY    ? -age 81  ? ? ?Current Outpatient Medications  ?Medication Sig Dispense Refill  ? aspirin 81 MG EC tablet 1 tablet    ? cholecalciferol (VITAMIN D3) 25 MCG (1000 UNIT) tablet 5 capsules    ? Coenzyme Q10 (CO Q-10) 300 MG CAPS Take by mouth  daily.    ? furosemide (LASIX) 20 MG tablet Take 20 mg by mouth daily.    ? KLOR-CON M20 20 MEQ tablet Take 1 tablet by mouth daily.    ? losartan (COZAAR) 25 MG tablet TK 1 T PO D  1  ? simvastatin (ZOCOR) 10 MG tablet Take 1 tablet by mouth daily.  6  ? verapamil (VERELAN PM) 240 MG 24 hr capsule Take 240 mg by mouth at bedtime.    ? ?No current facility-administered medications for this visit.  ? ? ?Family History  ?Problem Relation Age of Onset  ? Breast cancer Sister 14  ?     Stage III  ? Cancer Sister 73  ?     Lung ca  ? Diabetes Mother   ? Hypertension Mother   ? Prostate cancer Father   ?     deceased age 71  ? Hypertension Brother   ? Cancer Brother   ?     neck cancer  ? Hypertension Maternal Grandmother   ? Pancreatic cancer Paternal Grandmother   ? ? ?Review of Systems  ?All other systems reviewed and are negative. ? ?Exam:   ?BP 140/88   Pulse 63   Ht '5\' 7"'$  (1.702 m)   Wt 275 lb (124.7 kg)   LMP 01/29/1984 (  Within Months)   SpO2 99%   BMI 43.07 kg/m?     ?General appearance: alert, cooperative and appears stated age ?Head: normocephalic, without obvious abnormality, atraumatic ?Neck: no adenopathy, supple, symmetrical, trachea midline and thyroid normal to inspection and palpation ?Lungs: clear to auscultation bilaterally ?Breasts: normal appearance, no masses or tenderness, No nipple retraction or dimpling, No nipple discharge or bleeding, No axillary adenopathy ?Heart: regular rate and rhythm ?Abdomen: soft, non-tender; no masses, no organomegaly ?Extremities: extremities normal, atraumatic, no cyanosis or edema ?Skin: skin color, texture, turgor normal. No rashes or lesions ?Lymph nodes: cervical, supraclavicular, and axillary nodes normal. ?Neurologic: grossly normal ? ?Pelvic: External genitalia:  no lesions ?             No abnormal inguinal nodes palpated. ?             Urethra:  normal appearing urethra with no masses, tenderness or lesions ?             Bartholins and Skenes: normal     ?             Vagina: normal appearing vagina with normal color and discharge, no lesions ?             Cervix: absent ?             Pap taken: yes ?Bimanual Exam:  Uterus:  absent ?             Adnexa: no mass, fullness, tenderness ?             Rectal exam: declined. ? ?Chaperone was present for exam:  yes. ? ?Assessment:   ?Well woman visit with GYN exam. ?GYN exam for high risk Medicare patient.  ?Status post TAH/BSO. ?LGSIL of vaginal cuff.  ?Positive HR HPV. ?Hx lichen simplex chronicus.  ?FH breast cancer:  Sister.  ? ?Plan: ?Mammogram screening discussed. ?Self breast awareness reviewed. ?Pap and HR HPV collected.  ?We discussed abnormal paps, HR HPV, and colposcopy. ?Guidelines for Calcium, Vitamin D, regular exercise program including cardiovascular and weight bearing exercise. ?  ?Follow up annually and prn.  ? ?After visit summary provided.  ? ?19 min  total time was spent for this patient encounter, including preparation, face-to-face counseling with the patient, coordination of care, and documentation of the encounter. ? ? ? ? ?

## 2021-09-13 ENCOUNTER — Ambulatory Visit (INDEPENDENT_AMBULATORY_CARE_PROVIDER_SITE_OTHER): Payer: Medicare HMO | Admitting: Obstetrics and Gynecology

## 2021-09-13 ENCOUNTER — Encounter: Payer: Self-pay | Admitting: Obstetrics and Gynecology

## 2021-09-13 ENCOUNTER — Other Ambulatory Visit (HOSPITAL_COMMUNITY)
Admission: RE | Admit: 2021-09-13 | Discharge: 2021-09-13 | Disposition: A | Discharge status: Home / Self Care | Payer: Medicare HMO | Source: Ambulatory Visit | Attending: Obstetrics and Gynecology | Admitting: Obstetrics and Gynecology

## 2021-09-13 VITALS — BP 140/88 | HR 63 | Ht 67.0 in | Wt 275.0 lb

## 2021-09-13 DIAGNOSIS — Z9189 Other specified personal risk factors, not elsewhere classified: Secondary | ICD-10-CM

## 2021-09-13 DIAGNOSIS — Z1272 Encounter for screening for malignant neoplasm of vagina: Secondary | ICD-10-CM

## 2021-09-13 DIAGNOSIS — R87811 Vaginal high risk human papillomavirus (HPV) DNA test positive: Secondary | ICD-10-CM | POA: Diagnosis not present

## 2021-09-13 DIAGNOSIS — N890 Mild vaginal dysplasia: Secondary | ICD-10-CM | POA: Insufficient documentation

## 2021-09-13 DIAGNOSIS — Z01419 Encounter for gynecological examination (general) (routine) without abnormal findings: Secondary | ICD-10-CM

## 2021-09-13 DIAGNOSIS — Z1151 Encounter for screening for human papillomavirus (HPV): Secondary | ICD-10-CM | POA: Diagnosis not present

## 2021-09-13 NOTE — Patient Instructions (Signed)
EXERCISE AND DIET:  We recommended that you start or continue a regular exercise program for good health. Regular exercise means any activity that makes your heart beat faster and makes you sweat.  We recommend exercising at least 30 minutes per day at least 3 days a week, preferably 4 or 5.  We also recommend a diet low in fat and sugar.  Inactivity, poor dietary choices and obesity can cause diabetes, heart attack, stroke, and kidney damage, among others.   ? ?ALCOHOL AND SMOKING:  Women should limit their alcohol intake to no more than 7 drinks/beers/glasses of wine (combined, not each!) per week. Moderation of alcohol intake to this level decreases your risk of breast cancer and liver damage. And of course, no recreational drugs are part of a healthy lifestyle.  And absolutely no smoking or even second hand smoke. Most people know smoking can cause heart and lung diseases, but did you know it also contributes to weakening of your bones? Aging of your skin?  Yellowing of your teeth and nails? ? ?CALCIUM AND VITAMIN D:  Adequate intake of calcium and Vitamin D are recommended.  The recommendations for exact amounts of these supplements seem to change often, but generally speaking 600 mg of calcium (either carbonate or citrate) and 800 units of Vitamin D per day seems prudent. Certain women may benefit from higher intake of Vitamin D.  If you are among these women, your doctor will have told you during your visit.   ? ?PAP SMEARS:  Pap smears, to check for cervical cancer or precancers,  have traditionally been done yearly, although recent scientific advances have shown that most women can have pap smears less often.  However, every woman still should have a physical exam from her gynecologist every year. It will include a breast check, inspection of the vulva and vagina to check for abnormal growths or skin changes, a visual exam of the cervix, and then an exam to evaluate the size and shape of the uterus and  ovaries.  And after 77 years of age, a rectal exam is indicated to check for rectal cancers. We will also provide age appropriate advice regarding health maintenance, like when you should have certain vaccines, screening for sexually transmitted diseases, bone density testing, colonoscopy, mammograms, etc.  ? ?MAMMOGRAMS:  All women over 45 years old should have a yearly mammogram. Many facilities now offer a "3D" mammogram, which may cost around $50 extra out of pocket. If possible,  we recommend you accept the option to have the 3D mammogram performed.  It both reduces the number of women who will be called back for extra views which then turn out to be normal, and it is better than the routine mammogram at detecting truly abnormal areas.   ? ?COLONOSCOPY:  Colonoscopy to screen for colon cancer is recommended for all women at age 18.  We know, you hate the idea of the prep.  We agree, BUT, having colon cancer and not knowing it is worse!!  Colon cancer so often starts as a polyp that can be seen and removed at colonscopy, which can quite literally save your life!  And if your first colonoscopy is normal and you have no family history of colon cancer, most women don't have to have it again for 10 years.  Once every ten years, you can do something that may end up saving your life, right?  We will be happy to help you get it scheduled when you are ready.  Be sure to check your insurance coverage so you understand how much it will cost.  It may be covered as a preventative service at no cost, but you should check your particular policy.   ? ?Calcium Content in Foods ?Calcium is the most abundant mineral in the body. Most of the body's calcium supply is stored in bones and teeth. Calcium helps many parts of the body function normally, including: ?Blood and blood vessels. ?Nerves. ?Hormones. ?Muscles. ?Bones and teeth. ?When your calcium stores are low, you may be at risk for low bone mass, bone loss, and broken bones  (fractures). When you get enough calcium, it helps to support strong bones and teeth throughout your life. ?Calcium is especially important for: ?Children during growth spurts. ?Girls during adolescence. ?Women who are pregnant or breastfeeding. ?Women after their menstrual cycle stops (postmenopause). ?Women whose menstrual cycle has stopped due to anorexia nervosa or regular intense exercise. ?People who cannot eat or digest dairy products. ?Vegans. ?Recommended daily amounts of calcium: ?Women (ages 47 to 45): 1,000 mg per day. ?Women (ages 39 and older): 1,200 mg per day. ?Men (ages 72 to 62): 1,000 mg per day. ?Men (ages 44 and older): 1,200 mg per day. ?Women (ages 39 to 3): 1,300 mg per day. ?Men (ages 33 to 64): 1,300 mg per day. ?General information ?Eat foods that are high in calcium. Try to get most of your calcium from food. ?Some people may benefit from taking calcium supplements. Check with your health care provider or diet and nutrition specialist (dietitian) before starting any calcium supplements. Calcium supplements may interact with certain medicines. Too much calcium may cause other health problems, such as constipation and kidney stones. ?For the body to absorb calcium, it needs vitamin D. Sources of vitamin D include: ?Skin exposure to direct sunlight. ?Foods, such as egg yolks, liver, mushrooms, saltwater fish, and fortified milk. ?Vitamin D supplements. Check with your health care provider or dietitian before starting any vitamin D supplements. ?What foods are high in calcium? ? ?Foods that are high in calcium contain more than 100 milligrams per serving. ?Fruits ?Fortified orange juice or other fruit juice, 300 mg per 8 oz serving. ?Vegetables ?Collard greens, 360 mg per 8 oz serving. ?Kale, 100 mg per 8 oz serving. ?Bok choy, 160 mg per 8 oz serving. ?Grains ?Fortified ready-to-eat cereals, 100 to 1,000 mg per 8 oz serving. ?Fortified frozen waffles, 200 mg in 2 waffles. ?Oatmeal, 140 mg in  1 cup. ?Meats and other proteins ?Sardines, canned with bones, 325 mg per 3 oz serving. ?Salmon, canned with bones, 180 mg per 3 oz serving. ?Canned shrimp, 125 mg per 3 oz serving. ?Baked beans, 160 mg per 4 oz serving. ?Tofu, firm, made with calcium sulfate, 253 mg per 4 oz serving. ?Dairy ?Yogurt, plain, low-fat, 310 mg per 6 oz serving. ?Nonfat milk, 300 mg per 8 oz serving. ?American cheese, 195 mg per 1 oz serving. ?Cheddar cheese, 205 mg per 1 oz serving. ?Cottage cheese 2%, 105 mg per 4 oz serving. ?Fortified soy, rice, or almond milk, 300 mg per 8 oz serving. ?Mozzarella, part skim, 210 mg per 1 oz serving. ?The items listed above may not be a complete list of foods high in calcium. Actual amounts of calcium may be different depending on processing. Contact a dietitian for more information. ?What foods are lower in calcium? ?Foods that are lower in calcium contain 50 mg or less per serving. ?Fruits ?Apple, about 6 mg. ?Banana, about 12 mg. ?  Vegetables ?Lettuce, 19 mg per 2 oz serving. ?Tomato, about 11 mg. ?Grains ?Rice, 4 mg per 6 oz serving. ?Boiled potatoes, 14 mg per 8 oz serving. ?White bread, 6 mg per slice. ?Meats and other proteins ?Egg, 27 mg per 2 oz serving. ?Red meat, 7 mg per 4 oz serving. ?Chicken, 17 mg per 4 oz serving. ?Fish, cod, or trout, 20 mg per 4 oz serving. ?Dairy ?Cream cheese, regular, 14 mg per 1 Tbsp serving. ?Brie cheese, 50 mg per 1 oz serving. ?Parmesan cheese, 70 mg per 1 Tbsp serving. ?The items listed above may not be a complete list of foods lower in calcium. Actual amounts of calcium may be different depending on processing. Contact a dietitian for more information. ?Summary ?Calcium is an important mineral in the body because it affects many functions. Getting enough calcium helps support strong bones and teeth throughout your life. ?Try to get most of your calcium from food. ?Calcium supplements may interact with certain medicines. Check with your health care provider  or dietitian before starting any calcium supplements. ?This information is not intended to replace advice given to you by your health care provider. Make sure you discuss any questions you have with your h

## 2021-09-15 ENCOUNTER — Other Ambulatory Visit: Payer: Self-pay

## 2021-09-15 ENCOUNTER — Emergency Department (HOSPITAL_COMMUNITY): Payer: Medicare HMO

## 2021-09-15 ENCOUNTER — Encounter (HOSPITAL_COMMUNITY): Payer: Self-pay | Admitting: Emergency Medicine

## 2021-09-15 ENCOUNTER — Emergency Department (HOSPITAL_COMMUNITY)
Admission: EM | Admit: 2021-09-15 | Discharge: 2021-09-15 | Disposition: A | Discharge status: Home / Self Care | Payer: Medicare HMO | Attending: Emergency Medicine | Admitting: Emergency Medicine

## 2021-09-15 DIAGNOSIS — R110 Nausea: Secondary | ICD-10-CM

## 2021-09-15 DIAGNOSIS — J986 Disorders of diaphragm: Secondary | ICD-10-CM | POA: Diagnosis not present

## 2021-09-15 DIAGNOSIS — R002 Palpitations: Secondary | ICD-10-CM | POA: Diagnosis not present

## 2021-09-15 DIAGNOSIS — I700 Atherosclerosis of aorta: Secondary | ICD-10-CM | POA: Diagnosis not present

## 2021-09-15 LAB — TROPONIN I (HIGH SENSITIVITY)
Troponin I (High Sensitivity): 4 ng/L (ref <18)
Troponin I (High Sensitivity): 4 ng/L (ref <18)

## 2021-09-15 LAB — CBC WITH DIFFERENTIAL/PLATELET
Abs Immature Granulocytes: 0.02 10*3/uL (ref 0.00–0.07)
Basophils Absolute: 0 10*3/uL (ref 0.0–0.1)
Basophils Relative: 1 %
Eosinophils Absolute: 0.1 10*3/uL (ref 0.0–0.5)
Eosinophils Relative: 2 %
HCT: 42.7 % (ref 36.0–46.0)
Hemoglobin: 13.6 g/dL (ref 12.0–15.0)
Immature Granulocytes: 0 %
Lymphocytes Relative: 19 %
Lymphs Abs: 1.2 10*3/uL (ref 0.7–4.0)
MCH: 31.9 pg (ref 26.0–34.0)
MCHC: 31.9 g/dL (ref 30.0–36.0)
MCV: 100 fL (ref 80.0–100.0)
Monocytes Absolute: 0.9 10*3/uL (ref 0.1–1.0)
Monocytes Relative: 14 %
Neutro Abs: 4.2 10*3/uL (ref 1.7–7.7)
Neutrophils Relative %: 64 %
Platelets: 206 10*3/uL (ref 150–400)
RBC: 4.27 MIL/uL (ref 3.87–5.11)
RDW: 12.8 % (ref 11.5–15.5)
WBC: 6.4 10*3/uL (ref 4.0–10.5)
nRBC: 0 % (ref 0.0–0.2)

## 2021-09-15 LAB — BASIC METABOLIC PANEL
Anion gap: 9 (ref 5–15)
BUN: 17 mg/dL (ref 8–23)
CO2: 26 mmol/L (ref 22–32)
Calcium: 9.3 mg/dL (ref 8.9–10.3)
Chloride: 102 mmol/L (ref 98–111)
Creatinine, Ser: 1 mg/dL (ref 0.44–1.00)
GFR, Estimated: 58 mL/min — ABNORMAL LOW (ref >60)
Glucose, Bld: 102 mg/dL — ABNORMAL HIGH (ref 70–99)
Potassium: 4 mmol/L (ref 3.5–5.1)
Sodium: 137 mmol/L (ref 135–145)

## 2021-09-15 LAB — CYTOLOGY - PAP
Comment: NEGATIVE
High risk HPV: POSITIVE — AB

## 2021-09-15 LAB — I-STAT CHEM 8, ED
BUN: 21 mg/dL (ref 8–23)
Calcium, Ion: 1.11 mmol/L — ABNORMAL LOW (ref 1.15–1.40)
Chloride: 102 mmol/L (ref 98–111)
Creatinine, Ser: 1 mg/dL (ref 0.44–1.00)
Glucose, Bld: 98 mg/dL (ref 70–99)
HCT: 43 % (ref 36.0–46.0)
Hemoglobin: 14.6 g/dL (ref 12.0–15.0)
Potassium: 3.9 mmol/L (ref 3.5–5.1)
Sodium: 138 mmol/L (ref 135–145)
TCO2: 26 mmol/L (ref 22–32)

## 2021-09-15 MED ORDER — ONDANSETRON HCL 4 MG/2ML IJ SOLN
4.0000 mg | Freq: Once | INTRAMUSCULAR | Status: AC
  Administered 2021-09-15: 4 mg via INTRAVENOUS
  Filled 2021-09-15 (×2): qty 2

## 2021-09-15 MED ORDER — ONDANSETRON 8 MG PO TBDP
ORAL_TABLET | ORAL | 0 refills | Status: DC
Start: 2021-09-15 — End: 2022-10-02

## 2021-09-15 NOTE — ED Triage Notes (Addendum)
Pt here from home. Pt reports that she woke up and felt nauseous, felt like she was having palpitations, was dizzy and diaphoretic. Pt reports feeling of having to have BM, went to bathroom and had BM and symptoms resolved. Pt reports she called nurse line and they called her to come get checked out Pt reports no fever, cough, or congestion.  Pt reports hx of afib and hypertension

## 2021-09-15 NOTE — ED Notes (Signed)
Pt ambulatory to restroom

## 2021-09-15 NOTE — ED Provider Notes (Addendum)
[facility] EMERGENCY DEPARTMENT Provider Note   CSN: [id number] Arrival date & time: 09/15/21  0536     History  Chief Complaint  Patient presents with   Nausea   Palpitations    Leslie Webb is a 76 y.o. female.  The history is provided by the patient.  Illness Location:  At home Quality:  Was felting sweaty and nauseated and then had a large soft BM and symptoms resolved but called nurse line and was told to come in Severity:  Moderate Onset quality:  Sudden Timing:  Constant Progression:  Resolved Chronicity:  New Context:  Prior to having large soft BM Worsened by:  Nothing Ineffective treatments:  None Associated symptoms: no abdominal pain, no chest pain, no congestion, no cough, no diarrhea, no fever, no loss of consciousness, no rash, no shortness of breath and no vomiting       Home Medications Prior to Admission medications   Medication Sig Start Date End Date Taking? Authorizing Provider  aspirin 81 MG EC tablet 1 tablet    [provider]  cholecalciferol (VITAMIN D3) 25 MCG (1000 UNIT) tablet 5 capsules    [provider]  Coenzyme Q10 (CO Q-10) 300 MG CAPS Take by mouth daily.    [provider]  furosemide (LASIX) 20 MG tablet Take 20 mg by mouth daily.    [provider]  KLOR-CON M20 20 MEQ tablet Take 1 tablet by mouth daily. 03/09/13   [provider]  losartan (COZAAR) 25 MG tablet TK 1 T PO D 03/18/17   [provider]  simvastatin (ZOCOR) 10 MG tablet Take 1 tablet by mouth daily. 04/18/16   [provider]  verapamil (VERELAN PM) 240 MG 24 hr capsule Take 240 mg by mouth at bedtime.    [provider]      Allergies    Patient has no known allergies.    Review of Systems   Review of Systems  Constitutional:  Negative for fever.  HENT:  Negative for congestion.   Eyes:  Negative for redness.  Respiratory:  Negative for cough and shortness of breath.    Cardiovascular:  Negative for chest pain and leg swelling.  Gastrointestinal:  Negative for abdominal pain, diarrhea and vomiting.  Musculoskeletal:  Negative for arthralgias.  Skin:  Negative for rash.  Neurological:  Negative for loss of consciousness.  All other systems reviewed and are negative.  Physical Exam Updated Vital Signs LMP 01/29/1984 (Within Months)  Physical Exam Vitals and nursing note reviewed. Exam conducted with a chaperone present.  Constitutional:      General: She is not in acute distress.    Appearance: Normal appearance.  HENT:     Head: Normocephalic and atraumatic.     Nose: Nose normal.  Eyes:     Conjunctiva/sclera: Conjunctivae normal.     Pupils: Pupils are equal, round, and reactive to light.  Cardiovascular:     Rate and Rhythm: Normal rate and regular rhythm.     Pulses: Normal pulses.     Heart sounds: Normal heart sounds.  Pulmonary:     Effort: Pulmonary effort is normal.     Breath sounds: Normal breath sounds.  Abdominal:     General: Bowel sounds are normal.     Palpations: Abdomen is soft.     Tenderness: There is no abdominal tenderness. There is no guarding or rebound.  Musculoskeletal:        General: Normal range of motion.  Cervical back: Normal range of motion and neck supple.  Skin:    General: Skin is warm and dry.     Capillary Refill: Capillary refill takes less than 2 seconds.  Neurological:     General: No focal deficit present.     Mental Status: She is alert and oriented to person, place, and time.     Deep Tendon Reflexes: Reflexes normal.  Psychiatric:        Mood and Affect: Mood normal.        Behavior: Behavior normal.    ED Results / Procedures / Treatments   Labs (all labs ordered are listed, but only abnormal results are displayed) Results for orders placed or performed during the hospital encounter of 09/15/21  CBC with Differential  Result Value Ref Range   WBC 6.4 4.0 - 10.5 K/uL   RBC 4.27  3.87 - 5.11 MIL/uL   Hemoglobin 13.6 12.0 - 15.0 g/dL   HCT 42.7 36.0 - 46.0 %   MCV 100.0 80.0 - 100.0 fL   MCH 31.9 26.0 - 34.0 pg   MCHC 31.9 30.0 - 36.0 g/dL   RDW 12.8 11.5 - 15.5 %   Platelets 206 150 - 400 K/uL   nRBC 0.0 0.0 - 0.2 %   Neutrophils Relative % 64 %   Neutro Abs 4.2 1.7 - 7.7 K/uL   Lymphocytes Relative 19 %   Lymphs Abs 1.2 0.7 - 4.0 K/uL   Monocytes Relative 14 %   Monocytes Absolute 0.9 0.1 - 1.0 K/uL   Eosinophils Relative 2 %   Eosinophils Absolute 0.1 0.0 - 0.5 K/uL   Basophils Relative 1 %   Basophils Absolute 0.0 0.0 - 0.1 K/uL   Immature Granulocytes 0 %   Abs Immature Granulocytes 0.02 0.00 - 0.07 K/uL  I-stat chem 8, ED (not at [facility] or [facility])  Result Value Ref Range   Sodium 138 135 - 145 mmol/L   Potassium 3.9 3.5 - 5.1 mmol/L   Chloride 102 98 - 111 mmol/L   BUN 21 8 - 23 mg/dL   Creatinine, Ser 1.00 0.44 - 1.00 mg/dL   Glucose, Bld 98 70 - 99 mg/dL   Calcium, Ion 1.11 (L) 1.15 - 1.40 mmol/L   TCO2 26 22 - 32 mmol/L   Hemoglobin 14.6 12.0 - 15.0 g/dL   HCT 43.0 36.0 - 46.0 %   DG Chest Portable 1 View  Result Date: 09/15/2021 CLINICAL DATA:  76 year old female with history of palpitations. EXAM: PORTABLE CHEST 1 VIEW COMPARISON:  No priors. FINDINGS: Lung volumes are slightly low. Elevation of the left hemidiaphragm. No acute consolidative airspace disease. No pleural effusions. No pneumothorax. No evidence of pulmonary edema. Heart size is normal. Upper mediastinal contours are within normal limits. Atherosclerotic calcifications are noted in the thoracic aorta. IMPRESSION: 1. Low lung volumes without radiographic evidence of acute cardiopulmonary disease. 2. Mild elevation of the left hemidiaphragm. 3. Aortic atherosclerosis. Electronically Signed   By: Vinnie Langton M.D.   On: 09/15/2021 06:09      EKG  EKG Interpretation  Date/Time:  Friday Sep 15 2021 05:47:07 EDT Ventricular Rate:  69 PR Interval:  178 QRS Duration: 115 QT  Interval:  414 QTC Calculation: 444 R Axis:   63 Text Interpretation: Sinus rhythm Confirmed by Randal Buba, Allaina Brotzman ([id number]) on 09/15/2021 5:50:31 AM         Radiology No results found.  Procedures Procedures    Medications Ordered in ED Medications  ondansetron (ZOFRAN)  injection 4 mg (has no administration in time range)    ED Course/ Medical Decision Making/ A&P                           Medical Decision Making Patient with sensation of needed to vomiting and became sweaty and then had a large soft BM. Symptoms have gone away   Amount and/or Complexity of Data Reviewed External Data Reviewed: notes.    Details: outside notes reviewed Labs: ordered.    Details: all labs reviewed by me: normal CBC with normal white count 6.4 and normal hemoglobin 13.6 and normal platelets 206,000.  Normal sodium 138 and potassium 3.9 Normal bun 21 and creatinine 1.0 Radiology: ordered and independent interpretation performed.    Details: No acute finding by me on CXR  Risk Prescription drug management. Risk Details: Symptoms likely secondary to vagal from having large BM.  WIll need a second troponin.  Signed out to Dr. Johnney Killian pending troponins.  Patient resting comfortably without any complaints at this time       Aniya Jolicoeur, MD 09/15/21 [phone]

## 2021-09-15 NOTE — Discharge Instructions (Signed)
1.  See your doctor for recheck.  Discussed the need for follow-up heart stress test after your visit in the emergency department.  At this time your heart labs are normal.  There is no sign you had a heart attack last night.  Test done in the emergency department however do not mean there is no possibility that you are developing heart disease.  Return to emergency department immediately if you have chest pain, feel you going to pass out or other concerning symptoms.

## 2021-09-15 NOTE — ED Provider Notes (Signed)
Patient reports she often sleeps on her couch.  At 10 in the morning she was sleeping in an upright and seated position with her feet on the floor.  She had a blanket on.  She awakened and felt nauseated and palpitations with epigastric discomfort.  She reports initially she felt too weak to walk to the bathroom.  However once she felt a little better she went to the bathroom and had a very large bowel movement and then symptoms resolved.  Patient's sister came to her house to measure blood pressures.  She was somewhat concerned because blood pressures were in the low 100s which is lower than normal for her.  Patient reports she does take her blood pressure medications in the evening.  She thinks she took her dose at 6 PM.  Patient reports she does have intermittent atrial fibrillation.  She reports she can usually tell when she is having palpitations.  Patient has been asymptomatic since the event. Physical Exam  BP 138/74   Pulse 64   Temp 97.7 F (36.5 C) (Oral)   Resp 17   Ht '5\' 7"'$  (1.702 m)   Wt 124.7 kg   LMP 01/29/1984 (Within Months)   SpO2 99%   BMI 43.07 kg/m   Physical Exam  Procedures  Procedures  ED Course / MDM    Medical Decision Making Amount and/or Complexity of Data Reviewed Labs: ordered. Radiology: ordered.  Risk Prescription drug management.   Patient is alert and nontoxic.  Mental status clear.  No respiratory distress.  Vital signs are normal.  Monitor shows a sinus rhythm in the 60s and 70s narrow complex.  She is consistently normotensive and oxygenating at 99 to 100% on room air.  2 sets of troponins are normal  I extensively reviewed events surrounding the time patient was symptomatic with the patient and her sister.  At this time appears likely there is a vasovagal element to urgency to have a bowel movement.  Patient's sister did document blood pressures at that time and although to the lower 150V systolic, patient was normotensive with normal heart  rate.  Given patient's age and risk factors, patient has been counseled she must return immediately if she feels any type of chest pain, weakness or nausea or other concerning symptoms.  She is also counseled to follow-up with her PCP as soon as possible to schedule outpatient stress testing if has not been done within the past year.  Patient voices understanding.       Charlesetta Shanks, MD 09/15/21 [phone]

## 2021-09-18 ENCOUNTER — Other Ambulatory Visit: Payer: Self-pay

## 2021-09-18 DIAGNOSIS — R87622 Low grade squamous intraepithelial lesion on cytologic smear of vagina (LGSIL): Secondary | ICD-10-CM

## 2021-09-28 NOTE — Progress Notes (Unsigned)
  Subjective:     Patient ID: Leslie Webb, female   DOB: 07-20-1945, 76 y.o.   MRN: [id number]  HPI  Patient here today for colposcopy with pap 09-13-21 LSIL:Pos HR HPV.  PAP HISTORY: 09-13-21 LSIL:POS HR HPV 09/08/20-ASCUS, HPV+; 06/08/19-LGSIL, HPV+; 06/02/18-WNL, HPV+ History of abnormal Pap: yes, 09/08/20-ASCUS, HPV+; Colpo 10/13/20-VAIN 1; 06/08/19-LGSIL, HPV+; Colpo 06/30/19- VAIN 1; 06/02/18-WNL, HPV+; Colpo 06/18/18-atrophy; 05/31/17-LGSIL, HPV+; Colpo 06/26/17-WNL; 04/15/16-ASCUS, HPV+; Colpo 05/23/16-VAIN 1, 04/19/15-LGSIL,HPV+; Colpo 05/11/15-VAIN 1; 04/09/14- LGSIL, HPV+; Colpo 05/06/14-benign    Review of Systems  All other systems reviewed and are negative.  LMP: Hyst      Objective:   Physical Exam Vaginal cuff atrophy and subtle acetowhite change.  2 cuff biopsies done.     Assessment:     ***LGSIL vagina.  Positve HR HPV   Plan:     Discussion of HPV, paps, colposcopy.  Anticipated next screening in one year.

## 2021-10-03 ENCOUNTER — Other Ambulatory Visit (HOSPITAL_COMMUNITY)
Admission: RE | Admit: 2021-10-03 | Discharge: 2021-10-03 | Disposition: A | Discharge status: Home / Self Care | Payer: Medicare HMO | Source: Ambulatory Visit | Attending: Obstetrics and Gynecology | Admitting: Obstetrics and Gynecology

## 2021-10-03 ENCOUNTER — Ambulatory Visit: Payer: Medicare HMO | Admitting: Obstetrics and Gynecology

## 2021-10-03 ENCOUNTER — Encounter: Payer: Self-pay | Admitting: Obstetrics and Gynecology

## 2021-10-03 VITALS — BP 138/84 | Ht 67.75 in | Wt 275.0 lb

## 2021-10-03 DIAGNOSIS — R87622 Low grade squamous intraepithelial lesion on cytologic smear of vagina (LGSIL): Secondary | ICD-10-CM | POA: Insufficient documentation

## 2021-10-03 DIAGNOSIS — R87811 Vaginal high risk human papillomavirus (HPV) DNA test positive: Secondary | ICD-10-CM

## 2021-10-03 DIAGNOSIS — N870 Mild cervical dysplasia: Secondary | ICD-10-CM | POA: Diagnosis not present

## 2021-10-04 NOTE — Patient Instructions (Signed)
Colposcopy, Care After  The following information offers guidance on how to care for yourself after your procedure. Your doctor may also give you more specific instructions. If you have problems or questions, contact your doctor. What can I expect after the procedure? If you did not have a sample of your tissue taken out (did not have a biopsy), you may only have some spotting of blood for a few days. You can go back to your normal activities. If you had a sample of your tissue taken out, it is common to have: Soreness and mild pain. These may last for a few days. Mild bleeding or fluid (discharge) coming from your vagina. The fluid will look dark and grainy. You may have this for a few days. The fluid may be caused by a liquid that was used during your procedure. You may need to wear a sanitary pad. Spotting of blood for at least 48 hours after the procedure. Follow these instructions at home: Medicines Take over-the-counter and prescription medicines only as told by your doctor. Ask your doctor what over-the-counter pain medicines and prescription medicines you can start taking again. This is very important if you take blood thinners. Activity For at least 3 days, or for as long as told by your doctor, avoid: Douching. Using tampons. Having sex. Return to your normal activities as told by your doctor. Ask your doctor what activities are safe for you. General instructions Ask your doctor if you may take baths, swim, or use a hot tub. You may take showers. If you use birth control (contraception), keep using it. Keep all follow-up visits. Contact a doctor if: You have a fever or chills. You faint or feel light-headed. Get help right away if: You bleed a lot from your vagina. A lot of bleeding means that the bleeding soaks through a pad in less than 1 hour. You have clumps of blood (blood clots) coming from your vagina. You have signs that could mean you have an infection. This may be  fluid coming from your vagina that is: Different than normal. Yellow. Bad-smelling. You have very bad pain or cramps in your lower belly that do not get better with medicine. Summary If you did not have a sample of your tissue taken out, you may only have some spotting of blood for a few days. You can go back to your normal activities. If you had a sample of your tissue taken out, it is common to have mild pain for a few days and spotting for 48 hours. Avoid douching, using tampons, and having sex for at least 3 days after the procedure or for as long as told. Get help right away if you have a lot of bleeding, very bad pain, or signs of infection. This information is not intended to replace advice given to you by your health care provider. Make sure you discuss any questions you have with your health care provider. Document Revised: 09/11/2020 Document Reviewed: 09/11/2020 Elsevier Patient Education  2023 [employer]  

## 2021-10-06 LAB — SURGICAL PATHOLOGY

## 2021-10-13 DIAGNOSIS — T7840XA Allergy, unspecified, initial encounter: Secondary | ICD-10-CM | POA: Diagnosis not present

## 2021-10-13 DIAGNOSIS — S30861A Insect bite (nonvenomous) of abdominal wall, initial encounter: Secondary | ICD-10-CM | POA: Diagnosis not present

## 2021-11-29 DIAGNOSIS — I10 Essential (primary) hypertension: Secondary | ICD-10-CM | POA: Diagnosis not present

## 2021-11-29 DIAGNOSIS — E7800 Pure hypercholesterolemia, unspecified: Secondary | ICD-10-CM | POA: Diagnosis not present

## 2021-12-06 DIAGNOSIS — E7800 Pure hypercholesterolemia, unspecified: Secondary | ICD-10-CM | POA: Diagnosis not present

## 2021-12-06 DIAGNOSIS — Z23 Encounter for immunization: Secondary | ICD-10-CM | POA: Diagnosis not present

## 2021-12-06 DIAGNOSIS — K5909 Other constipation: Secondary | ICD-10-CM | POA: Diagnosis not present

## 2021-12-06 DIAGNOSIS — R609 Edema, unspecified: Secondary | ICD-10-CM | POA: Diagnosis not present

## 2021-12-06 DIAGNOSIS — I10 Essential (primary) hypertension: Secondary | ICD-10-CM | POA: Diagnosis not present

## 2021-12-06 DIAGNOSIS — M4802 Spinal stenosis, cervical region: Secondary | ICD-10-CM | POA: Diagnosis not present

## 2021-12-06 DIAGNOSIS — M159 Polyosteoarthritis, unspecified: Secondary | ICD-10-CM | POA: Diagnosis not present

## 2021-12-06 DIAGNOSIS — I493 Ventricular premature depolarization: Secondary | ICD-10-CM | POA: Diagnosis not present

## 2022-01-26 DIAGNOSIS — Z23 Encounter for immunization: Secondary | ICD-10-CM | POA: Diagnosis not present

## 2022-01-29 ENCOUNTER — Other Ambulatory Visit: Payer: Self-pay | Admitting: Obstetrics and Gynecology

## 2022-01-29 DIAGNOSIS — Z1231 Encounter for screening mammogram for malignant neoplasm of breast: Secondary | ICD-10-CM

## 2022-03-06 ENCOUNTER — Ambulatory Visit: Payer: Medicare HMO

## 2022-04-04 ENCOUNTER — Ambulatory Visit
Admission: RE | Admit: 2022-04-04 | Discharge: 2022-04-04 | Disposition: A | Discharge status: Home / Self Care | Payer: Medicare HMO | Source: Ambulatory Visit | Attending: Obstetrics and Gynecology | Admitting: Obstetrics and Gynecology

## 2022-04-04 DIAGNOSIS — Z1231 Encounter for screening mammogram for malignant neoplasm of breast: Secondary | ICD-10-CM | POA: Diagnosis not present

## 2022-04-30 HISTORY — PX: OTHER SURGICAL HISTORY: SHX169

## 2022-05-31 DIAGNOSIS — I10 Essential (primary) hypertension: Secondary | ICD-10-CM | POA: Diagnosis not present

## 2022-05-31 DIAGNOSIS — E559 Vitamin D deficiency, unspecified: Secondary | ICD-10-CM | POA: Diagnosis not present

## 2022-05-31 DIAGNOSIS — E7800 Pure hypercholesterolemia, unspecified: Secondary | ICD-10-CM | POA: Diagnosis not present

## 2022-05-31 DIAGNOSIS — R609 Edema, unspecified: Secondary | ICD-10-CM | POA: Diagnosis not present

## 2022-05-31 DIAGNOSIS — I493 Ventricular premature depolarization: Secondary | ICD-10-CM | POA: Diagnosis not present

## 2022-06-07 DIAGNOSIS — Z23 Encounter for immunization: Secondary | ICD-10-CM | POA: Diagnosis not present

## 2022-06-07 DIAGNOSIS — E7800 Pure hypercholesterolemia, unspecified: Secondary | ICD-10-CM | POA: Diagnosis not present

## 2022-06-07 DIAGNOSIS — I493 Ventricular premature depolarization: Secondary | ICD-10-CM | POA: Diagnosis not present

## 2022-06-07 DIAGNOSIS — Z0000 Encounter for general adult medical examination without abnormal findings: Secondary | ICD-10-CM | POA: Diagnosis not present

## 2022-06-07 DIAGNOSIS — K130 Diseases of lips: Secondary | ICD-10-CM | POA: Diagnosis not present

## 2022-06-07 DIAGNOSIS — M549 Dorsalgia, unspecified: Secondary | ICD-10-CM | POA: Diagnosis not present

## 2022-06-07 DIAGNOSIS — I10 Essential (primary) hypertension: Secondary | ICD-10-CM | POA: Diagnosis not present

## 2022-06-07 DIAGNOSIS — R609 Edema, unspecified: Secondary | ICD-10-CM | POA: Diagnosis not present

## 2022-06-07 DIAGNOSIS — Z6841 Body Mass Index (BMI) 40.0 and over, adult: Secondary | ICD-10-CM | POA: Diagnosis not present

## 2022-06-07 DIAGNOSIS — H61111 Acquired deformity of pinna, right ear: Secondary | ICD-10-CM | POA: Diagnosis not present

## 2022-07-13 DIAGNOSIS — H00012 Hordeolum externum right lower eyelid: Secondary | ICD-10-CM | POA: Diagnosis not present

## 2022-08-01 DIAGNOSIS — H43813 Vitreous degeneration, bilateral: Secondary | ICD-10-CM | POA: Diagnosis not present

## 2022-09-10 DIAGNOSIS — H25811 Combined forms of age-related cataract, right eye: Secondary | ICD-10-CM | POA: Diagnosis not present

## 2022-09-10 DIAGNOSIS — H43812 Vitreous degeneration, left eye: Secondary | ICD-10-CM | POA: Diagnosis not present

## 2022-09-18 NOTE — Progress Notes (Signed)
77 y.o. G2P0020 Single African American female here for breast and pelvic exam.   She is followed for abnormal paps and positive high risk HPV.   Will have cataract surgery.  Two sisters with breast cancer. Older sister has had breast cancer and lung cancer.  Younger sister dx with breast cancer, intestinal cancer, kidney cancer.  Father with prostate cancer.  Brother with throat cancer.   PCP:   Irena Reichmann, DO  Patient's last menstrual period was 01/29/1984 (within months).           Sexually active: No.  The current method of family planning is status post hysterectomy.    Exercising: No.     Smoker:  no  Health Maintenance: Pap:  09/13/21 LSIL:HR HPV positive, 09/08/20 ASCUS: HR HPV positive History of abnormal Pap:  yes MMG:  04/04/22 Breast Density Cat B, BI-RADS CAT 1 neg Colonoscopy:  04/30/09.  Did Cologuard through her PCP about 3 years ago and was negative.  BMD:   02/04/19  Result  normal TDaP:  unsure Gardasil:   no HIV: n/a, donated blood.  Hep C: n/a, donated blood.  Screening Labs:  PCP   reports that she has never smoked. She has never used smokeless tobacco. She reports current alcohol use of about 1.0 standard drink of alcohol per week. She reports that she does not use drugs.  Past Medical History:  Diagnosis Date   Anemia    Breast calcifications    Fibroid    Genital warts    History of colon polyps    Hypercholesterolemia    Hypertension    Lichen simplex chronicus 11/2007   --vulva   Migraine    Obesity    STD (sexually transmitted disease)    genital warts   Vitamin D deficiency     Past Surgical History:  Procedure Laterality Date   ABDOMINAL HYSTERECTOMY     TAH/BSO age 63   BREAST BIOPSY Right    benign   BREAST SURGERY     Rt. breast Bx--benign   MYOMECTOMY     -age 44    Current Outpatient Medications  Medication Sig Dispense Refill   aspirin 81 MG EC tablet 81 mg daily.     cholecalciferol (VITAMIN D3) 25 MCG (1000 UNIT)  tablet Take 1,000 Units by mouth daily.     Coenzyme Q10 (CO Q-10) 300 MG CAPS Take by mouth daily.     furosemide (LASIX) 20 MG tablet Take 20 mg by mouth daily.     KLOR-CON M20 20 MEQ tablet Take 20 mEq by mouth daily.     losartan (COZAAR) 25 MG tablet Take 25 mg by mouth daily.  1   simvastatin (ZOCOR) 10 MG tablet Take 10 mg by mouth daily.  6   verapamil (VERELAN PM) 240 MG 24 hr capsule Take 240 mg by mouth at bedtime.     No current facility-administered medications for this visit.    Family History  Problem Relation Age of Onset   Diabetes Mother    Hypertension Mother    Prostate cancer Father        deceased age 78   Breast cancer Sister 41       Stage III   Cancer Sister 77       Lung ca   Breast cancer Sister    Hypertension Brother    Cancer Brother        neck cancer   Hypertension Maternal Grandmother    Pancreatic  cancer Paternal Grandmother     Review of Systems  All other systems reviewed and are negative.   Exam:   BP 138/84 (BP Location: Left Arm, Patient Position: Sitting, Cuff Size: Large)   Pulse 68   Ht 5\' 7"  (1.702 m)   Wt 278 lb (126.1 kg)   LMP 01/29/1984 (Within Months)   SpO2 97%   BMI 43.54 kg/m     General appearance: alert, cooperative and appears stated age Head: normocephalic, without obvious abnormality, atraumatic Neck: no adenopathy, supple, symmetrical, trachea midline and thyroid normal to inspection and palpation Lungs: clear to auscultation bilaterally Breasts: normal appearance, no masses or tenderness, No nipple retraction or dimpling, No nipple discharge or bleeding, No axillary adenopathy Heart: regular rate and rhythm Abdomen: soft, non-tender; no masses, no organomegaly Extremities: extremities normal, atraumatic, no cyanosis or edema Skin: skin color, texture, turgor normal. No rashes or lesions Lymph nodes: cervical, supraclavicular, and axillary nodes normal. Neurologic: grossly normal  Pelvic: External  genitalia:  no lesions              No abnormal inguinal nodes palpated.              Urethra:  normal appearing urethra with no masses, tenderness or lesions              Bartholins and Skenes: normal                 Vagina: normal appearing vagina with normal color and discharge, no lesions              Cervix: absent              Pap taken: yes Bimanual Exam:  Uterus:  absent              Adnexa: no mass, fullness, tenderness              Rectal exam: yes.  Confirms.              Anus:  normal sphincter tone, no lesions  Chaperone was present for exam:  Warren Lacy, CMA  Assessment:   GYN exam for high risk Medicare patient.  Status post TAH/BSO. LGSIL of vaginal cuff.  Positive HR HPV. Hx lichen simplex chronicus.  FH cancers.  2 sisters with breast cancer.   Plan: Mammogram screening discussed. Self breast awareness reviewed. Pap and HR HPV collected.  Guidelines for Calcium, Vitamin D, regular exercise program including cardiovascular and weight bearing exercise. She will consider genetic counseling and testing.  Benefits of testing reviewed with patient.  Follow up annually and prn.   After visit summary provided.   20  total time was spent for this patient encounter, including preparation, face-to-face counseling with the patient, coordination of care, and documentation of the encounter with respect to family history of cancers and genetic counseling/testing in addition to doing the breast/pelvic and pap.

## 2022-09-26 ENCOUNTER — Ambulatory Visit: Payer: Medicare HMO | Admitting: Obstetrics and Gynecology

## 2022-10-02 ENCOUNTER — Other Ambulatory Visit (HOSPITAL_COMMUNITY)
Admission: RE | Admit: 2022-10-02 | Discharge: 2022-10-02 | Disposition: A | Discharge status: Home / Self Care | Payer: Medicare HMO | Source: Ambulatory Visit | Attending: Obstetrics and Gynecology | Admitting: Obstetrics and Gynecology

## 2022-10-02 ENCOUNTER — Encounter: Payer: Self-pay | Admitting: Obstetrics and Gynecology

## 2022-10-02 ENCOUNTER — Ambulatory Visit (INDEPENDENT_AMBULATORY_CARE_PROVIDER_SITE_OTHER): Payer: Medicare HMO | Admitting: Obstetrics and Gynecology

## 2022-10-02 VITALS — BP 138/84 | HR 68 | Ht 67.0 in | Wt 278.0 lb

## 2022-10-02 DIAGNOSIS — Z1151 Encounter for screening for human papillomavirus (HPV): Secondary | ICD-10-CM | POA: Diagnosis not present

## 2022-10-02 DIAGNOSIS — Z9189 Other specified personal risk factors, not elsewhere classified: Secondary | ICD-10-CM | POA: Insufficient documentation

## 2022-10-02 DIAGNOSIS — Z1272 Encounter for screening for malignant neoplasm of vagina: Secondary | ICD-10-CM | POA: Diagnosis not present

## 2022-10-02 DIAGNOSIS — N890 Mild vaginal dysplasia: Secondary | ICD-10-CM | POA: Diagnosis not present

## 2022-10-02 DIAGNOSIS — R87811 Vaginal high risk human papillomavirus (HPV) DNA test positive: Secondary | ICD-10-CM | POA: Diagnosis not present

## 2022-10-02 DIAGNOSIS — Z809 Family history of malignant neoplasm, unspecified: Secondary | ICD-10-CM | POA: Diagnosis not present

## 2022-10-02 NOTE — Patient Instructions (Signed)

## 2022-10-09 LAB — CYTOLOGY - PAP
Comment: NEGATIVE
High risk HPV: POSITIVE — AB

## 2022-10-12 ENCOUNTER — Other Ambulatory Visit: Payer: Self-pay

## 2022-10-12 DIAGNOSIS — R87622 Low grade squamous intraepithelial lesion on cytologic smear of vagina (LGSIL): Secondary | ICD-10-CM

## 2022-10-24 NOTE — Progress Notes (Deleted)
GYNECOLOGY  VISIT   HPI: 77 y.o.   Single  African American  female   G2P0020 with Patient's last menstrual period was 01/29/1984 (within months).   here for   colpo  GYNECOLOGIC HISTORY: Patient's last menstrual period was 01/29/1984 (within months). Contraception:  hyst Menopausal hormone therapy:  n/a Last mammogram:  04/04/22 Breast Density Cat B, BI-RADS CAT 1 neg  Last pap smear: 10/02/22 LSIL: HR HPV positive,  09/13/21 LSIL:HR HPV positive, 09/08/20 ASCUS: HR HPV positive         OB History     Gravida  2   Para  0   Term  0   Preterm  0   AB  2   Living  0      SAB      IAB  2   Ectopic      Multiple      Live Births                 Patient Active Problem List   Diagnosis Date Noted   VAIN I (vaginal intraepithelial neoplasia grade I) 10/16/2020   Vaginal high risk HPV DNA test positive 10/16/2020    Past Medical History:  Diagnosis Date   Anemia    Breast calcifications    Fibroid    Genital warts    History of colon polyps    Hypercholesterolemia    Hypertension    Lichen simplex chronicus 11/2007   --vulva   Migraine    Obesity    STD (sexually transmitted disease)    genital warts   Vitamin D deficiency     Past Surgical History:  Procedure Laterality Date   ABDOMINAL HYSTERECTOMY     TAH/BSO age 73   BREAST BIOPSY Right    benign   BREAST SURGERY     Rt. breast Bx--benign   MYOMECTOMY     -age 49    Current Outpatient Medications  Medication Sig Dispense Refill   aspirin 81 MG EC tablet 81 mg daily.     cholecalciferol (VITAMIN D3) 25 MCG (1000 UNIT) tablet Take 1,000 Units by mouth daily.     Coenzyme Q10 (CO Q-10) 300 MG CAPS Take by mouth daily.     furosemide (LASIX) 20 MG tablet Take 20 mg by mouth daily.     KLOR-CON M20 20 MEQ tablet Take 20 mEq by mouth daily.     losartan (COZAAR) 25 MG tablet Take 25 mg by mouth daily.  1   simvastatin (ZOCOR) 10 MG tablet Take 10 mg by mouth daily.  6   verapamil (VERELAN PM)  240 MG 24 hr capsule Take 240 mg by mouth at bedtime.     No current facility-administered medications for this visit.     ALLERGIES: Patient has no known allergies.  Family History  Problem Relation Age of Onset   Diabetes Mother    Hypertension Mother    Prostate cancer Father        deceased age 64   Breast cancer Sister 58       Stage III   Cancer Sister 47       Lung ca   Breast cancer Sister    Hypertension Brother    Cancer Brother        neck cancer   Hypertension Maternal Grandmother    Pancreatic cancer Paternal Grandmother     Social History   Socioeconomic History   Marital status: Single    Spouse name:  Not on file   Number of children: Not on file   Years of education: Not on file   Highest education level: Not on file  Occupational History   Not on file  Tobacco Use   Smoking status: Never   Smokeless tobacco: Never  Vaping Use   Vaping Use: Never used  Substance and Sexual Activity   Alcohol use: Yes    Alcohol/week: 1.0 standard drink of alcohol    Types: 1 Standard drinks or equivalent per week    Comment: 2 glasses of wine/month   Drug use: No   Sexual activity: Never    Partners: Male    Birth control/protection: Surgical    Comment: TAH/BSO  Other Topics Concern   Not on file  Social History Narrative   Not on file   Social Determinants of Health   Financial Resource Strain: Not on file  Food Insecurity: Not on file  Transportation Needs: Not on file  Physical Activity: Not on file  Stress: Not on file  Social Connections: Not on file  Intimate Partner Violence: Not on file    Review of Systems  PHYSICAL EXAMINATION:    LMP 01/29/1984 (Within Months)     General appearance: alert, cooperative and appears stated age Head: Normocephalic, without obvious abnormality, atraumatic Neck: no adenopathy, supple, symmetrical, trachea midline and thyroid normal to inspection and palpation Lungs: clear to auscultation  bilaterally Breasts: normal appearance, no masses or tenderness, No nipple retraction or dimpling, No nipple discharge or bleeding, No axillary or supraclavicular adenopathy Heart: regular rate and rhythm Abdomen: soft, non-tender, no masses,  no organomegaly Extremities: extremities normal, atraumatic, no cyanosis or edema Skin: Skin color, texture, turgor normal. No rashes or lesions Lymph nodes: Cervical, supraclavicular, and axillary nodes normal. No abnormal inguinal nodes palpated Neurologic: Grossly normal  Pelvic: External genitalia:  no lesions              Urethra:  normal appearing urethra with no masses, tenderness or lesions              Bartholins and Skenes: normal                 Vagina: normal appearing vagina with normal color and discharge, no lesions              Cervix: no lesions                Bimanual Exam:  Uterus:  normal size, contour, position, consistency, mobility, non-tender              Adnexa: no mass, fullness, tenderness              Rectal exam: {yes no:314532}.  Confirms.              Anus:  normal sphincter tone, no lesions  Chaperone was present for exam:  ***  ASSESSMENT     PLAN     An After Visit Summary was printed and given to the patient.  ______ minutes face to face time of which over 50% was spent in counseling.

## 2022-11-07 ENCOUNTER — Encounter: Payer: Medicare HMO | Admitting: Obstetrics and Gynecology

## 2022-11-16 ENCOUNTER — Encounter: Payer: Self-pay | Admitting: Obstetrics and Gynecology

## 2022-11-16 ENCOUNTER — Ambulatory Visit: Payer: Medicare HMO | Admitting: Obstetrics and Gynecology

## 2022-11-16 ENCOUNTER — Other Ambulatory Visit (HOSPITAL_COMMUNITY)
Admission: RE | Admit: 2022-11-16 | Discharge: 2022-11-16 | Disposition: A | Discharge status: Home / Self Care | Payer: Medicare HMO | Source: Ambulatory Visit | Attending: Obstetrics and Gynecology | Admitting: Obstetrics and Gynecology

## 2022-11-16 VITALS — BP 130/82 | HR 69 | Ht 67.0 in | Wt 278.0 lb

## 2022-11-16 DIAGNOSIS — Z6841 Body Mass Index (BMI) 40.0 and over, adult: Secondary | ICD-10-CM | POA: Diagnosis not present

## 2022-11-16 DIAGNOSIS — R87622 Low grade squamous intraepithelial lesion on cytologic smear of vagina (LGSIL): Secondary | ICD-10-CM

## 2022-11-16 DIAGNOSIS — N762 Acute vulvitis: Secondary | ICD-10-CM | POA: Diagnosis not present

## 2022-11-16 DIAGNOSIS — R87811 Vaginal high risk human papillomavirus (HPV) DNA test positive: Secondary | ICD-10-CM | POA: Insufficient documentation

## 2022-11-16 DIAGNOSIS — N900 Mild vulvar dysplasia: Secondary | ICD-10-CM | POA: Diagnosis not present

## 2022-11-16 MED ORDER — TRIAMCINOLONE ACETONIDE 0.025 % EX OINT: 1.0000 | TOPICAL_OINTMENT | Freq: Two times a day (BID) | CUTANEOUS | 0 refills | Status: DC

## 2022-11-16 NOTE — Progress Notes (Signed)
GYNECOLOGY  VISIT   HPI: 77 y.o.   Single  African American  female   G2P0020 with Patient's last menstrual period was 01/29/1984 (within months).   here for   colpo. Pt has some itching in the pubic area, no bumps.  Wears pads regularly due to urinary incontinence.   Working in the garden with outdoor plants and having itching and rash on her skin.    GYNECOLOGIC HISTORY: Patient's last menstrual period was 01/29/1984 (within months). Contraception:  hyst Menopausal hormone therapy:  n/a Last mammogram:  04/04/22 Breast Density Cat B, BI-RADS CAT 1 neg  Last pap smear:  10/02/22 LSIL: HR HPV positive, 09/13/21 LSIL:HR HPV positive, 09/08/20 ASCUS: HR HPV positive         OB History     Gravida  2   Para  0   Term  0   Preterm  0   AB  2   Living  0      SAB      IAB  2   Ectopic      Multiple      Live Births                 Patient Active Problem List   Diagnosis Date Noted   VAIN I (vaginal intraepithelial neoplasia grade I) 10/16/2020   Vaginal high risk HPV DNA test positive 10/16/2020    Past Medical History:  Diagnosis Date   Anemia    Breast calcifications    Fibroid    Genital warts    History of colon polyps    Hypercholesterolemia    Hypertension    Lichen simplex chronicus 11/2007   --vulva   Migraine    Obesity    STD (sexually transmitted disease)    genital warts   Vitamin D deficiency     Past Surgical History:  Procedure Laterality Date   ABDOMINAL HYSTERECTOMY     TAH/BSO age 57   BREAST BIOPSY Right    benign   BREAST SURGERY     Rt. breast Bx--benign   MYOMECTOMY     -age 66    Current Outpatient Medications  Medication Sig Dispense Refill   aspirin 81 MG EC tablet 81 mg daily.     cholecalciferol (VITAMIN D3) 25 MCG (1000 UNIT) tablet Take 1,000 Units by mouth daily.     Coenzyme Q10 (CO Q-10) 300 MG CAPS Take by mouth daily.     furosemide (LASIX) 20 MG tablet Take 20 mg by mouth daily.     KLOR-CON M20 20 MEQ  tablet Take 20 mEq by mouth daily.     losartan (COZAAR) 25 MG tablet Take 25 mg by mouth daily.  1   simvastatin (ZOCOR) 10 MG tablet Take 10 mg by mouth daily.  6   triamcinolone (KENALOG) 0.025 % ointment Apply 1 Application topically 2 (two) times daily. Uses twice daily for 2 weeks a a time as needed. 30 g 0   verapamil (VERELAN PM) 240 MG 24 hr capsule Take 240 mg by mouth at bedtime.     No current facility-administered medications for this visit.     ALLERGIES: Patient has no known allergies.  Family History  Problem Relation Age of Onset   Diabetes Mother    Hypertension Mother    Prostate cancer Father        deceased age 21   Breast cancer Sister 27       Stage III   Cancer  Sister 89       Lung ca   Breast cancer Sister    Hypertension Brother    Cancer Brother        neck cancer   Hypertension Maternal Grandmother    Pancreatic cancer Paternal Grandmother     Social History   Socioeconomic History   Marital status: Single    Spouse name: Not on file   Number of children: Not on file   Years of education: Not on file   Highest education level: Not on file  Occupational History   Not on file  Tobacco Use   Smoking status: Never   Smokeless tobacco: Never  Vaping Use   Vaping status: Never Used  Substance and Sexual Activity   Alcohol use: Yes    Alcohol/week: 1.0 standard drink of alcohol    Types: 1 Standard drinks or equivalent per week    Comment: 2 glasses of wine/month   Drug use: No   Sexual activity: Never    Partners: Male    Birth control/protection: Surgical    Comment: TAH/BSO  Other Topics Concern   Not on file  Social History Narrative   Not on file   Social Determinants of Health   Financial Resource Strain: Not on file  Food Insecurity: Not on file  Transportation Needs: Not on file  Physical Activity: Not on file  Stress: Not on file  Social Connections: Not on file  Intimate Partner Violence: Not on file    Review of  Systems  All other systems reviewed and are negative.   PHYSICAL EXAMINATION:    BP 130/82 (BP Location: Right Arm, Patient Position: Sitting, Cuff Size: Normal)   Pulse 69   Ht 5\' 7"  (1.702 m)   Wt 278 lb (126.1 kg)   LMP 01/29/1984 (Within Months)   SpO2 98%   BMI 43.54 kg/m     General appearance: alert, cooperative and appears stated age   Colposcopy - vagina and vulva.  Consent for procedure.  3% acetic acid used in vagina and on vulva. White light used. Findings:    Cervix:  absent Vagina:  acetowhite thickening of the vaginal apex.  No specific lesions.  Lugol's showed left vaginal apex with 1 cm are of decreased uptake and midline and right vaginal apex with tiny scattered of decreased uptake of approximately 5 - 7 mm area. Vulva:  bilateral mid labia majora with thickened greyish tone to skin.  No specific demarcated lesions.  No perianal lesions. Biopsies:   left vaginal apex and right vaginal apex.  Monsel's placed.  Minimal EBL. No complications.   Chaperone was present for exam:  Warren Lacy, CMA  ASSESSMENT  LGSIL of vagina.  Positive HR HPV.  Acute vulvitis.  I suspect irritation from pad use.  PLAN  Fu biopsies.  Anticipate pap and HR HPV testing in one year.  We discussed treatment methods of high grade vaginal dysplasia. Triamcinolone ointment for vulvitis.   She will switch to a different pad type. Fu prn.    In addition to the colposcopy, the patient was treated for vulvitis today.

## 2022-11-16 NOTE — Patient Instructions (Signed)
Colposcopy, Care After  The following information offers guidance on how to care for yourself after your procedure. Your health care provider may also give you more specific instructions. If you have problems or questions, contact your health care provider. What can I expect after the procedure? If you had a colposcopy without a biopsy, you can expect to feel fine right away after your procedure. However, you may have some spotting of blood for a few days. You can return to your normal activities. If you had a colposcopy with a biopsy, it is common after the procedure to have: Soreness and mild pain. These may last for a few days. Mild vaginal bleeding or discharge that is dark-colored and grainy. This may last for a few days. The discharge may be caused by a liquid (solution) that was used during the procedure. You may need to wear a sanitary pad during this time. Spotting of blood for at least 48 hours after the procedure. Follow these instructions at home: Medicines Take over-the-counter and prescription medicines only as told by your health care provider. Talk with your health care provider about what type of over-the-counter pain medicines and prescription medicines you can start to take again. It is especially important to talk with your health care provider if you take blood thinners. Activity Avoid using douche products, using tampons, and having sex for at least 3 days after the procedure or for as long as told by your health care provider. Return to your normal activities as told by your health care provider. Ask your health care provider what activities are safe for you. General instructions Ask your health care provider if you may take baths, swim, or use a hot tub. You may take showers. If you use birth control (contraception), continue to use it. Keep all follow-up visits. This is important. Contact a health care provider if: You have a fever or chills. You faint or feel  light-headed. Get help right away if: You have heavy bleeding from your vagina or pass blood clots. Heavy bleeding is bleeding that soaks through a sanitary pad in less than 1 hour. You have vaginal discharge that is abnormal, is yellow in color, or smells bad. This could be a sign of infection. You have severe pain or cramps in your lower abdomen that do not go away with medicine. Summary If you had a colposcopy without a biopsy, you can expect to feel fine right away, but you may have some spotting of blood for a few days. You can return to your normal activities. If you had a colposcopy with a biopsy, it is common to have mild pain for a few days and spotting for 48 hours after the procedure. Avoid using douche products, using tampons, and having sex for at least 3 days after the procedure or for as long as told by your health care provider. Get help right away if you have heavy bleeding, severe pain, or signs of infection. This information is not intended to replace advice given to you by your health care provider. Make sure you discuss any questions you have with your health care provider. Document Revised: 09/11/2020 Document Reviewed: 09/11/2020 Elsevier Patient Education  2024 [employer]  

## 2022-11-20 LAB — SURGICAL PATHOLOGY

## 2022-12-05 DIAGNOSIS — E7800 Pure hypercholesterolemia, unspecified: Secondary | ICD-10-CM | POA: Diagnosis not present

## 2022-12-05 DIAGNOSIS — E559 Vitamin D deficiency, unspecified: Secondary | ICD-10-CM | POA: Diagnosis not present

## 2022-12-05 DIAGNOSIS — E119 Type 2 diabetes mellitus without complications: Secondary | ICD-10-CM | POA: Diagnosis not present

## 2022-12-05 DIAGNOSIS — Z79899 Other long term (current) drug therapy: Secondary | ICD-10-CM | POA: Diagnosis not present

## 2022-12-05 DIAGNOSIS — I10 Essential (primary) hypertension: Secondary | ICD-10-CM | POA: Diagnosis not present

## 2022-12-06 DIAGNOSIS — H2511 Age-related nuclear cataract, right eye: Secondary | ICD-10-CM | POA: Diagnosis not present

## 2022-12-06 DIAGNOSIS — H25811 Combined forms of age-related cataract, right eye: Secondary | ICD-10-CM | POA: Diagnosis not present

## 2022-12-06 DIAGNOSIS — H268 Other specified cataract: Secondary | ICD-10-CM | POA: Diagnosis not present

## 2022-12-12 ENCOUNTER — Telehealth: Payer: Self-pay

## 2022-12-12 DIAGNOSIS — R946 Abnormal results of thyroid function studies: Secondary | ICD-10-CM | POA: Diagnosis not present

## 2022-12-12 DIAGNOSIS — R609 Edema, unspecified: Secondary | ICD-10-CM | POA: Diagnosis not present

## 2022-12-12 DIAGNOSIS — E559 Vitamin D deficiency, unspecified: Secondary | ICD-10-CM | POA: Diagnosis not present

## 2022-12-12 DIAGNOSIS — I493 Ventricular premature depolarization: Secondary | ICD-10-CM | POA: Diagnosis not present

## 2022-12-12 DIAGNOSIS — I10 Essential (primary) hypertension: Secondary | ICD-10-CM | POA: Diagnosis not present

## 2022-12-12 DIAGNOSIS — M159 Polyosteoarthritis, unspecified: Secondary | ICD-10-CM | POA: Diagnosis not present

## 2022-12-12 DIAGNOSIS — K219 Gastro-esophageal reflux disease without esophagitis: Secondary | ICD-10-CM | POA: Diagnosis not present

## 2022-12-12 DIAGNOSIS — E7800 Pure hypercholesterolemia, unspecified: Secondary | ICD-10-CM | POA: Diagnosis not present

## 2022-12-12 NOTE — Patient Instructions (Signed)
Visit Information  Thank you for taking time to visit with me today. Please don't hesitate to contact me if I can be of assistance to you.   Following are the goals we discussed today:   Goals Addressed             This Visit's Progress    COMPLETED: Care Coordination Activities-No follow up required       Care Coordination Interventions: Discussed [facility] services and support. Assessed SDOH. Advised to discuss with primary care physician if services needed in the future.         If you are experiencing a Mental Health or Behavioral Health Crisis or need someone to talk to, please call the Suicide and Crisis Lifeline: 988   Patient verbalizes understanding of instructions and care plan provided today and agrees to view in MyChart. Active MyChart status and patient understanding of how to access instructions and care plan via MyChart confirmed with patient.     The patient has been provided with contact information for the care management team and has been advised to call with any health related questions or concerns.   Dionne J Leath, RN, MSN [facility] Care Management Care Management Coordinator Direct Line [phone]     

## 2022-12-12 NOTE — Patient Outreach (Signed)
  Care Coordination   In Person Provider Office Visit Note   12/12/2022 Name: Leslie Webb MRN: [id number] DOB: Sep 07, 1945  Leslie Webb is a 77 y.o. year old female who sees Irena Reichmann, [place] for primary care. I engaged with Sharmaine Base in the providers office today.  What matters to the patients health and wellness today?  none    Goals Addressed             This Visit's Progress    COMPLETED: Care CoordinationActivities-No follow up required       Care Coordination Interventions: Discussed [facility] services and support. Assessed SDOH. Advised to discuss with primary care physician if services needed in the future.        SDOH assessments and interventions completed:  Yes  SDOH Interventions Today    Flowsheet Row Most Recent Value  SDOH Interventions   Housing Interventions Intervention Not Indicated  Transportation Interventions Intervention Not Indicated        Care Coordination Interventions:  Yes, provided   Follow up plan: No further intervention required.   Encounter Outcome:  Pt. Visit Completed   Bary Leriche, RN, MSN [facility] Care Management Care Management Coordinator Direct Line [phone]

## 2023-01-11 DIAGNOSIS — Z23 Encounter for immunization: Secondary | ICD-10-CM | POA: Diagnosis not present

## 2023-02-22 ENCOUNTER — Other Ambulatory Visit: Payer: Self-pay | Admitting: Obstetrics and Gynecology

## 2023-02-22 DIAGNOSIS — Z1231 Encounter for screening mammogram for malignant neoplasm of breast: Secondary | ICD-10-CM

## 2023-04-08 ENCOUNTER — Ambulatory Visit
Admission: RE | Admit: 2023-04-08 | Discharge: 2023-04-08 | Disposition: A | Discharge status: Home / Self Care | Payer: Medicare HMO | Source: Ambulatory Visit | Attending: Obstetrics and Gynecology | Admitting: Obstetrics and Gynecology

## 2023-04-08 DIAGNOSIS — Z1231 Encounter for screening mammogram for malignant neoplasm of breast: Secondary | ICD-10-CM | POA: Diagnosis not present

## 2023-06-12 DIAGNOSIS — I10 Essential (primary) hypertension: Secondary | ICD-10-CM | POA: Diagnosis not present

## 2023-06-12 DIAGNOSIS — E559 Vitamin D deficiency, unspecified: Secondary | ICD-10-CM | POA: Diagnosis not present

## 2023-06-12 DIAGNOSIS — K219 Gastro-esophageal reflux disease without esophagitis: Secondary | ICD-10-CM | POA: Diagnosis not present

## 2023-06-12 DIAGNOSIS — R946 Abnormal results of thyroid function studies: Secondary | ICD-10-CM | POA: Diagnosis not present

## 2023-06-12 DIAGNOSIS — E7800 Pure hypercholesterolemia, unspecified: Secondary | ICD-10-CM | POA: Diagnosis not present

## 2023-06-18 DIAGNOSIS — Z0000 Encounter for general adult medical examination without abnormal findings: Secondary | ICD-10-CM | POA: Diagnosis not present

## 2023-06-18 DIAGNOSIS — E559 Vitamin D deficiency, unspecified: Secondary | ICD-10-CM | POA: Diagnosis not present

## 2023-06-18 DIAGNOSIS — N390 Urinary tract infection, site not specified: Secondary | ICD-10-CM | POA: Diagnosis not present

## 2023-06-18 DIAGNOSIS — M4802 Spinal stenosis, cervical region: Secondary | ICD-10-CM | POA: Diagnosis not present

## 2023-06-18 DIAGNOSIS — I10 Essential (primary) hypertension: Secondary | ICD-10-CM | POA: Diagnosis not present

## 2023-06-18 DIAGNOSIS — K219 Gastro-esophageal reflux disease without esophagitis: Secondary | ICD-10-CM | POA: Diagnosis not present

## 2023-06-18 DIAGNOSIS — E7800 Pure hypercholesterolemia, unspecified: Secondary | ICD-10-CM | POA: Diagnosis not present

## 2023-06-18 DIAGNOSIS — M159 Polyosteoarthritis, unspecified: Secondary | ICD-10-CM | POA: Diagnosis not present

## 2023-07-08 DIAGNOSIS — Z1212 Encounter for screening for malignant neoplasm of rectum: Secondary | ICD-10-CM | POA: Diagnosis not present

## 2023-07-08 DIAGNOSIS — Z1211 Encounter for screening for malignant neoplasm of colon: Secondary | ICD-10-CM | POA: Diagnosis not present

## 2023-07-15 LAB — COLOGUARD: COLOGUARD: NEGATIVE

## 2023-11-13 NOTE — Progress Notes (Unsigned)
 78 y.o. G2P0020 Single African American female here for a breast and pelvic exam.    The patient is also followed for VAIN I. Last colposcopy 11/16/22 - VAIN I.  No vaginal bleeding or unusual discharge.   Not sexually active since her last visit.   Had left lateral breast pain for one day that resolved.  No mass.  No trauma.  Doing some heavy lifting.  Carries a heavy purse on her left side.   PCP: Gerome Brunet, DO   Patient's last menstrual period was 01/29/1984 (within months).           Sexually active: No.  The current method of family planning is status post hysterectomy and post menopausal status.    Menopausal hormone therapy:  n/a Exercising:  none  Smoker:  no  OB History     Gravida  2   Para  0   Term  0   Preterm  0   AB  2   Living  0      SAB      IAB  2   Ectopic      Multiple      Live Births              HEALTH MAINTENANCE: Last 2 paps: 10/02/22 LSIL, HR HPV +, 09/13/21 LSIL, HR HPV + History of abnormal Pap or positive HPV:  yes Mammogram:  04/08/23 Breast Density Cat B, BIRADS Cat 1 neg  Colonoscopy:  Cologuard 07/08/23 - negative Bone Density:  02/04/19  Result  normal.  Scheduled at [facility].    Immunization History  Administered Date(s) Administered   Influenza, High Dose Seasonal PF 01/11/2016   Influenza, Quadrivalent, Recombinant, Inj, Pf 02/11/2017, 01/10/2018, 12/26/2018, 02/12/2020, 01/06/2021   Influenza-Unspecified 01/28/2018   PFIZER(Purple Top)SARS-COV-2 Vaccination 03/12/2020   PNEUMOCOCCAL CONJUGATE-20 05/31/2021   Td 04/30/2006, 07/21/2007, 10/13/2014   Zoster Recombinant(Shingrix) 06/29/2021   Zoster, Live 10/21/2013      reports that she has quit smoking. Her smoking use included cigarettes. She has never used smokeless tobacco. She reports current alcohol use of about 1.0 standard drink of alcohol per week. She reports that she does not use drugs.  Past Medical History:  Diagnosis Date   Anemia    Breast  calcifications    Fibroid    Genital warts    History of colon polyps    Hypercholesterolemia    Hypertension    Lichen simplex chronicus 11/2007   --vulva   Migraine    Obesity    STD (sexually transmitted disease)    genital warts   Vitamin D deficiency     Past Surgical History:  Procedure Laterality Date   ABDOMINAL HYSTERECTOMY     TAH/BSO age 9   BREAST BIOPSY Right    benign   BREAST SURGERY     Rt. breast Bx--benign   cataract surgery  2024   Right eye   MYOMECTOMY     -age 68    Current Outpatient Medications  Medication Sig Dispense Refill   aspirin 81 MG EC tablet 81 mg daily.     cholecalciferol (VITAMIN D3) 25 MCG (1000 UNIT) tablet Take 1,000 Units by mouth daily.     Coenzyme Q10 (CO Q-10) 300 MG CAPS Take by mouth daily.     Collagen-Vitamin C-Biotin (COLLAGEN PO) Take by mouth.     furosemide (LASIX) 20 MG tablet Take 20 mg by mouth daily.     KLOR-CON M20 20 MEQ tablet Take  20 mEq by mouth daily.     losartan (COZAAR) 25 MG tablet Take 25 mg by mouth daily.  1   simvastatin (ZOCOR) 10 MG tablet Take 10 mg by mouth daily.  6   verapamil (VERELAN PM) 240 MG 24 hr capsule Take 240 mg by mouth at bedtime.     No current facility-administered medications for this visit.    ALLERGIES: Patient has no known allergies.  Family History  Problem Relation Age of Onset   Diabetes Mother    Hypertension Mother    Prostate cancer Father        deceased age 39   Breast cancer Sister 55       Stage III   Cancer Sister 77       Lung ca   Breast cancer Sister    Hypertension Brother    Cancer Brother        neck cancer   Hypertension Maternal Grandmother    Pancreatic cancer Paternal Grandmother     Review of Systems  All other systems reviewed and are negative.   PHYSICAL EXAM:  BP 122/70 (BP Location: Left Arm, Patient Position: Sitting)   Pulse 77   Ht 5' 7.5 (1.715 m)   Wt 285 lb (129.3 kg)   LMP 01/29/1984 (Within Months)   SpO2 96%    BMI 43.98 kg/m     General appearance: alert, cooperative and appears stated age Head: normocephalic, without obvious abnormality, atraumatic Neck: no adenopathy, supple, symmetrical, trachea midline and thyroid  normal to inspection and palpation Lungs: clear to auscultation bilaterally Breasts: right - normal appearance, no masses or tenderness, No nipple retraction or dimpling, No nipple discharge or bleeding, No axillary adenopathy Left - normal appearance, no masses  mild tenderness at 2:00 lateral breast, No nipple retraction or dimpling, No nipple discharge or bleeding, No axillary adenopathy Heart: regular rate and rhythm Abdomen: soft, non-tender; no masses, no organomegaly Extremities: extremities normal, atraumatic, no cyanosis or edema Skin: skin color, texture, turgor normal. No rashes or lesions Lymph nodes: cervical, supraclavicular, and axillary nodes normal. Neurologic: grossly normal  Pelvic: External genitalia:  no lesions              No abnormal inguinal nodes palpated.              Urethra:  normal appearing urethra with no masses, tenderness or lesions              Bartholins and Skenes: normal                 Vagina: normal appearing vagina with normal color and discharge, no lesions              Cervix: absent              Pap taken: yes Bimanual Exam:  Uterus:  absent              Adnexa: no mass, fullness, tenderness              Rectal exam: yes.  Confirms.              Anus:  normal sphincter tone, no lesions  Chaperone was present for exam:  Kari HERO, CMA  ASSESSMENT:  Encounter for breast and pelvic exam.  Personal hx of other medical treatment.  Status post TAH/BSO.  Hx VAIN I.  Stable. Positive HR HPV.  Spontaneous left breast pain, resolved.  FH breast cancer in 2 sisters.  PLAN:  Mammogram screening discussed.  Will do in December. She declines dx mammogram and left breast US .  Patient will monitor her left breast and contact me if she has  recurrent pain.  We discussed decreased caffeine use, bra change, her heavy purse, ice/heat to treat discomfort.  Self breast awareness reviewed. Pap and HRV collected:  yes Guidelines for Calcium, Vitamin D, regular exercise program including cardiovascular and weight bearing exercise. Medication refills:  NA Labs with PCP.  Follow up:  yearly and prn.     Additional counseling given.  yes. 25 min  total time was spent for this patient encounter, including preparation, face-to-face counseling with the patient, coordination of care, and documentation of the encounter in addition to doing the breast and pelvic exam and pap.

## 2023-11-14 ENCOUNTER — Ambulatory Visit (INDEPENDENT_AMBULATORY_CARE_PROVIDER_SITE_OTHER): Admitting: Obstetrics and Gynecology

## 2023-11-14 ENCOUNTER — Other Ambulatory Visit (HOSPITAL_COMMUNITY)
Admission: RE | Admit: 2023-11-14 | Discharge: 2023-11-14 | Disposition: A | Discharge status: Home / Self Care | Source: Ambulatory Visit | Attending: Obstetrics and Gynecology | Admitting: Obstetrics and Gynecology

## 2023-11-14 ENCOUNTER — Encounter: Payer: Self-pay | Admitting: Obstetrics and Gynecology

## 2023-11-14 VITALS — BP 122/70 | HR 77 | Ht 67.5 in | Wt 285.0 lb

## 2023-11-14 DIAGNOSIS — Z1272 Encounter for screening for malignant neoplasm of vagina: Secondary | ICD-10-CM | POA: Diagnosis not present

## 2023-11-14 DIAGNOSIS — Z9289 Personal history of other medical treatment: Secondary | ICD-10-CM | POA: Diagnosis not present

## 2023-11-14 DIAGNOSIS — N890 Mild vaginal dysplasia: Secondary | ICD-10-CM | POA: Insufficient documentation

## 2023-11-14 DIAGNOSIS — Z01411 Encounter for gynecological examination (general) (routine) with abnormal findings: Secondary | ICD-10-CM | POA: Diagnosis not present

## 2023-11-14 DIAGNOSIS — Z1151 Encounter for screening for human papillomavirus (HPV): Secondary | ICD-10-CM | POA: Diagnosis not present

## 2023-11-14 DIAGNOSIS — Z01419 Encounter for gynecological examination (general) (routine) without abnormal findings: Secondary | ICD-10-CM

## 2023-11-14 NOTE — Patient Instructions (Addendum)
 Calcium in Foods Calcium is a mineral in the body. Of all the minerals in your body, you have the most calcium. Most of the body's calcium supply is stored in bones and teeth. Calcium helps many parts of the body work, including: Blood and blood vessels. Nerves. Hormones. Muscles. Bones and teeth. When your calcium stores are low, you may be at risk for low bone mass, bone loss, and broken bones. When you get enough calcium, it helps to support strong bones and teeth throughout your life. Calcium is especially important for: Children during growth spurts. Females during adolescence. Females who are pregnant or breastfeeding. Females after their menstrual cycle stops (postmenopausal). Females whose menstrual cycle has stopped because of an eating disorder or regular intense exercise. People who can't eat or digest dairy products. People who eat a vegan diet. Recommended daily amounts of calcium: Females (ages 87 to 55): 1,000 mg per day. Females (ages 35 and older): 1,200 mg per day. Males (ages 53 to 45): 1,000 mg per day. Males (ages 43 and older): 1,200 mg per day. Females (ages 88 to 28): 1,300 mg per day. Males (ages 41 to 56): 1,300 mg per day. General information Eat foods that are high in calcium. Try to get most of your calcium from food. Some people may benefit from taking calcium supplements. Check with your health care provider or an expert in healthy eating called a dietitian before starting any calcium supplements. Calcium supplements may interact with certain medicines. Too much calcium may cause other health problems, such as trouble pooping and kidney stones. For the body to absorb calcium, it needs vitamin D. Sources of vitamin D include: Skin exposure to direct sunlight. Foods, such as egg yolks, liver, mushrooms, saltwater fish, and fortified milk. Vitamin D supplements. Check with your provider or dietitian before starting any vitamin D supplements. The amount of  calcium that is absorbed in the body varies with type of food. Talk to a dietitian about what foods are best for you, especially if you are eat a vegan diet or don't eat dairy. What foods are high in calcium?  Foods that are high in calcium contain more than 100 milligrams per serving. Fruits Fortified orange juice or other fruit juice, 300 mg per 8 oz (237 mL) serving. Vegetables Collard greens, 260 mg per 1 cup (130 g) serving, cooked. Kale, 180 mg per 1 cup (118 g) serving, cooked. Bok choy, 180 mg per 1 cup (170 g) serving, cooked Grains Fortified frozen waffles, 200 mg in 2 waffles. Oatmeal, 180 mg in 1 cup (234 g) serving, cooked. Fortified white bread, 175 mg per slice. Meats and other proteins Sardines, canned with bones, 350 mg per 3.75 oz (92 g) serving. Salmon, canned with bones, 168 mg per 3 oz (85 g) serving. Canned shrimp, 125 mg per 3 oz (85 g) serving. Baked beans, 120 mg per 1 cup (266 g) serving. Tofu, firm, made with calcium sulfate, 861 mg per  cup (126 g) serving. Dairy Yogurt, plain, low-fat, 448 mg per 1 cup (245 g) serving Nonfat milk, 300 mg per 1 cup (245 g) serving. American cheese, 145 mg per 1 oz (21 g) serving or 1 slice. Cheddar cheese, 200 mg per 1 oz (28 g) serving or 1 slice. Cottage cheese 2%, 125 mg per  cup (113 g) serving. Fortified soy, rice, or almond milk, 300 mg per 1 cup (237 mL) serving. Mozzarella, part skim, 210 mg per 1 oz (21 g) serving. The items listed  above may not be a complete list of foods high in calcium. Actual amounts of calcium may be different depending on processing. Contact a dietitian for more information. What foods are lower in calcium? Foods that are lower in calcium contain 50 mg or less per serving. Fruits Apple, 1 medium, about 6 mg. Banana, 1 medium, about 12 mg. Vegetables Lettuce, 19 mg per 1 cup (35 g) serving. Tomato, 1 small, about 11 mg. Grains Rice, white, 8 mg per  cup (79 g) serving. Boiled  potatoes, 14 mg per 1 cup (160 g) serving. White bread, 6 mg per slice. Meats and other proteins Egg, 24 mg per 1 egg (50 g). Red meat, 7 mg per 4 oz (80 g) serving. Chicken, 17 mg per 4 oz (113 g) serving. Fish, cod, or trout, 20 mg per 4 oz (140 g) serving. Dairy Cream cheese, regular, 14 mg per 1 Tbsp (15 g) serving. Brie cheese, 50 mg per 1 oz (32 g) serving. The items listed above may not be a complete list of foods lower in calcium. Actual amounts of calcium may be different depending on processing. Contact a dietitian for more information. This information is not intended to replace advice given to you by your health care provider. Make sure you discuss any questions you have with your health care provider. Document Revised: 01/12/2023 Document Reviewed: 01/12/2023 Elsevier Patient Education  2024 [employer]  EXERCISE AND DIET:  We recommended that you start or continue a regular exercise program for good health. Regular exercise means any activity that makes your heart beat faster and makes you sweat.  We recommend exercising at least 30 minutes per day at least 3 days a week, preferably 4 or 5.  We also recommend a diet low in fat and sugar.  Inactivity, poor dietary choices and obesity can cause diabetes, heart attack, stroke, and kidney damage, among others.    ALCOHOL AND SMOKING:  Women should limit their alcohol intake to no more than 7 drinks/beers/glasses of wine (combined, not each!) per week. Moderation of alcohol intake to this level decreases your risk of breast cancer and liver damage. And of course, no recreational drugs are part of a healthy lifestyle.  And absolutely no smoking or even second hand smoke. Most people know smoking can cause heart and lung diseases, but did you know it also contributes to weakening of your bones? Aging of your skin?  Yellowing of your teeth and nails?  CALCIUM AND VITAMIN D:  Adequate intake of calcium and Vitamin D are recommended.  The  recommendations for exact amounts of these supplements seem to change often, but generally speaking 600 mg of calcium (either carbonate or citrate) and 800 units of Vitamin D per day seems prudent. Certain women may benefit from higher intake of Vitamin D.  If you are among these women, your doctor will have told you during your visit.    PAP SMEARS:  Pap smears, to check for cervical cancer or precancers,  have traditionally been done yearly, although recent scientific advances have shown that most women can have pap smears less often.  However, every woman still should have a physical exam from her gynecologist every year. It will include a breast check, inspection of the vulva and vagina to check for abnormal growths or skin changes, a visual exam of the cervix, and then an exam to evaluate the size and shape of the uterus and ovaries.  And after 79 years of age, a rectal exam  is indicated to check for rectal cancers. We will also provide age appropriate advice regarding health maintenance, like when you should have certain vaccines, screening for sexually transmitted diseases, bone density testing, colonoscopy, mammograms, etc.   MAMMOGRAMS:  All women over 11 years old should have a yearly mammogram. Many facilities now offer a "3D" mammogram, which may cost around $50 extra out of pocket. If possible,  we recommend you accept the option to have the 3D mammogram performed.  It both reduces the number of women who will be called back for extra views which then turn out to be normal, and it is better than the routine mammogram at detecting truly abnormal areas.    COLONOSCOPY:  Colonoscopy to screen for colon cancer is recommended for all women at age 8.  We know, you hate the idea of the prep.  We agree, BUT, having colon cancer and not knowing it is worse!!  Colon cancer so often starts as a polyp that can be seen and removed at colonscopy, which can quite literally save your life!  And if your first  colonoscopy is normal and you have no family history of colon cancer, most women don't have to have it again for 10 years.  Once every ten years, you can do something that may end up saving your life, right?  We will be happy to help you get it scheduled when you are ready.  Be sure to check your insurance coverage so you understand how much it will cost.  It may be covered as a preventative service at no cost, but you should check your particular policy.

## 2023-11-20 LAB — CYTOLOGY - PAP
Comment: NEGATIVE
High risk HPV: POSITIVE — AB

## 2023-11-21 ENCOUNTER — Ambulatory Visit: Payer: Self-pay | Admitting: Nurse Practitioner

## 2023-11-21 DIAGNOSIS — R87622 Low grade squamous intraepithelial lesion on cytologic smear of vagina (LGSIL): Secondary | ICD-10-CM

## 2023-11-21 DIAGNOSIS — B977 Papillomavirus as the cause of diseases classified elsewhere: Secondary | ICD-10-CM

## 2023-12-17 DIAGNOSIS — E559 Vitamin D deficiency, unspecified: Secondary | ICD-10-CM | POA: Diagnosis not present

## 2023-12-17 DIAGNOSIS — Z780 Asymptomatic menopausal state: Secondary | ICD-10-CM | POA: Diagnosis not present

## 2023-12-17 DIAGNOSIS — E7800 Pure hypercholesterolemia, unspecified: Secondary | ICD-10-CM | POA: Diagnosis not present

## 2023-12-17 DIAGNOSIS — Z79899 Other long term (current) drug therapy: Secondary | ICD-10-CM | POA: Diagnosis not present

## 2023-12-17 DIAGNOSIS — K219 Gastro-esophageal reflux disease without esophagitis: Secondary | ICD-10-CM | POA: Diagnosis not present

## 2023-12-17 DIAGNOSIS — R7309 Other abnormal glucose: Secondary | ICD-10-CM | POA: Diagnosis not present

## 2023-12-24 DIAGNOSIS — R600 Localized edema: Secondary | ICD-10-CM | POA: Diagnosis not present

## 2023-12-24 DIAGNOSIS — E7800 Pure hypercholesterolemia, unspecified: Secondary | ICD-10-CM | POA: Diagnosis not present

## 2023-12-24 DIAGNOSIS — E559 Vitamin D deficiency, unspecified: Secondary | ICD-10-CM | POA: Diagnosis not present

## 2023-12-24 DIAGNOSIS — K219 Gastro-esophageal reflux disease without esophagitis: Secondary | ICD-10-CM | POA: Diagnosis not present

## 2023-12-24 DIAGNOSIS — M159 Polyosteoarthritis, unspecified: Secondary | ICD-10-CM | POA: Diagnosis not present

## 2023-12-24 DIAGNOSIS — I10 Essential (primary) hypertension: Secondary | ICD-10-CM | POA: Diagnosis not present

## 2023-12-24 DIAGNOSIS — R946 Abnormal results of thyroid function studies: Secondary | ICD-10-CM | POA: Diagnosis not present

## 2024-01-03 NOTE — Progress Notes (Unsigned)
 GYNECOLOGY  VISIT   HPI: 78 y.o.   Single  African American female   G2P0020 with Patient's last menstrual period was 01/29/1984 (within months).   here for: Colposcopy for pap showing LGSIL and positive HR HPV.   Patient has known VAIN I.     Has had hysterectomy.   GYNECOLOGIC HISTORY: Patient's last menstrual period was 01/29/1984 (within months). Contraception:  PMP Menopausal hormone therapy:  n/a Last 2 paps:  11/14/23 LSIL, HR HPV+, 10/02/22 LSIL HR HPV +  History of abnormal Pap or positive HPV:  yes Mammogram:  04/08/23 Breast Density Cat B, BIRADS Cat 1 neg         OB History     Gravida  2   Para  0   Term  0   Preterm  0   AB  2   Living  0      SAB      IAB  2   Ectopic      Multiple      Live Births                 Patient Active Problem List   Diagnosis Date Noted   VAIN I (vaginal intraepithelial neoplasia grade I) 10/16/2020   Vaginal high risk HPV DNA test positive 10/16/2020    Past Medical History:  Diagnosis Date   Anemia    Breast calcifications    Fibroid    Genital warts    History of colon polyps    Hypercholesterolemia    Hypertension    Lichen simplex chronicus 11/2007   --vulva   Migraine    Obesity    STD (sexually transmitted disease)    genital warts   Vitamin D deficiency     Past Surgical History:  Procedure Laterality Date   ABDOMINAL HYSTERECTOMY     TAH/BSO age 75   BREAST BIOPSY Right    benign   BREAST SURGERY     Rt. breast Bx--benign   cataract surgery  2024   Right eye   MYOMECTOMY     -age 43    Current Outpatient Medications  Medication Sig Dispense Refill   aspirin 81 MG EC tablet 81 mg daily.     cholecalciferol (VITAMIN D3) 25 MCG (1000 UNIT) tablet Take 1,000 Units by mouth daily.     Coenzyme Q10 (CO Q-10) 300 MG CAPS Take by mouth daily.     Collagen-Vitamin C-Biotin (COLLAGEN PO) Take by mouth.     furosemide (LASIX) 20 MG tablet Take 20 mg by mouth daily.     KLOR-CON M20  20 MEQ tablet Take 20 mEq by mouth daily.     losartan (COZAAR) 50 MG tablet Take 50 mg by mouth daily.     simvastatin (ZOCOR) 10 MG tablet Take 10 mg by mouth daily.  6   verapamil (VERELAN PM) 240 MG 24 hr capsule Take 240 mg by mouth at bedtime.     No current facility-administered medications for this visit.     ALLERGIES: Patient has no known allergies.  Family History  Problem Relation Age of Onset   Diabetes Mother    Hypertension Mother    Prostate cancer Father        deceased age 74   Breast cancer Sister 61       Stage III   Cancer Sister 64       Lung ca   Breast cancer Sister    Hypertension Brother  Cancer Brother        neck cancer   Hypertension Maternal Grandmother    Pancreatic cancer Paternal Grandmother     Social History   Socioeconomic History   Marital status: Single    Spouse name: Not on file   Number of children: Not on file   Years of education: Not on file   Highest education level: Not on file  Occupational History   Not on file  Tobacco Use   Smoking status: Former    Types: Cigarettes   Smokeless tobacco: Never  Vaping Use   Vaping status: Never Used  Substance and Sexual Activity   Alcohol use: Yes    Alcohol/week: 1.0 standard drink of alcohol    Types: 1 Standard drinks or equivalent per week    Comment: 2 glasses of wine/month   Drug use: No   Sexual activity: Not Currently    Partners: Male    Birth control/protection: Surgical    Comment: TAH/BSO, More than 5, after 15, abnormal pap, no DES, hx of STD  Other Topics Concern   Not on file  Social History Narrative   Not on file   Social Drivers of Health   Financial Resource Strain: Not on file  Food Insecurity: Not on file  Transportation Needs: No Transportation Needs (12/12/2022)   PRAPARE - Administrator, Civil Service (Medical): No    Lack of Transportation (Non-Medical): No  Physical Activity: Not on file  Stress: Not on file  Social  Connections: Not on file  Intimate Partner Violence: Not on file    Review of Systems  All other systems reviewed and are negative.   PHYSICAL EXAMINATION:   BP 126/84 (BP Location: Left Arm, Patient Position: Sitting)   Pulse 65   LMP 01/29/1984 (Within Months)   SpO2 97%     General appearance: alert, cooperative and appears stated age   Colposcopy - vagina Consent for procedure.  Time out done.  3% acetic acid used in vagina. White light and green light filter used.  Lugols applied. Findings:    Cervix:  absent Vagina:  atrophy noted.  Decreased Lugol's uptake left and right vaginal apex.  Biopsies:  left and right vaginal apex, sent separately to pathology.  Monsel's placed.  Minimal EBL. No complications.   Chaperone was present for exam:  Kari HERO, CMA  ASSESSMENT:  Status post TAH/BSO.  Hx VAIN I.   Pap LGSIL and positive HR HPV.   PLAN:  FU biopsies.  Anticipate pap and HR HPV in 1 year.  If has HGSIL, anticipated laser ablation.

## 2024-01-09 ENCOUNTER — Ambulatory Visit (INDEPENDENT_AMBULATORY_CARE_PROVIDER_SITE_OTHER): Admitting: Obstetrics and Gynecology

## 2024-01-09 ENCOUNTER — Encounter: Payer: Self-pay | Admitting: Obstetrics and Gynecology

## 2024-01-09 ENCOUNTER — Other Ambulatory Visit (HOSPITAL_COMMUNITY)
Admission: RE | Admit: 2024-01-09 | Discharge: 2024-01-09 | Disposition: A | Discharge status: Home / Self Care | Source: Ambulatory Visit | Attending: Obstetrics and Gynecology | Admitting: Obstetrics and Gynecology

## 2024-01-09 VITALS — BP 126/84 | HR 65

## 2024-01-09 DIAGNOSIS — Z87411 Personal history of vaginal dysplasia: Secondary | ICD-10-CM | POA: Diagnosis not present

## 2024-01-09 DIAGNOSIS — R87622 Low grade squamous intraepithelial lesion on cytologic smear of vagina (LGSIL): Secondary | ICD-10-CM | POA: Diagnosis not present

## 2024-01-09 DIAGNOSIS — R87811 Vaginal high risk human papillomavirus (HPV) DNA test positive: Secondary | ICD-10-CM | POA: Diagnosis not present

## 2024-01-09 DIAGNOSIS — N890 Mild vaginal dysplasia: Secondary | ICD-10-CM | POA: Diagnosis not present

## 2024-01-09 DIAGNOSIS — B977 Papillomavirus as the cause of diseases classified elsewhere: Secondary | ICD-10-CM

## 2024-01-09 NOTE — Patient Instructions (Signed)
 Colposcopy, Care After  The following information offers guidance on how to care for yourself after your procedure. Your health care provider may also give you more specific instructions. If you have problems or questions, contact your health care provider. What can I expect after the procedure? If you had a colposcopy without a biopsy, you can expect to feel fine right away after your procedure. However, you may have some spotting of blood for a few days. You can return to your normal activities. If you had a colposcopy with a biopsy, it is common after the procedure to have: Soreness and mild pain. These may last for a few days. Mild vaginal bleeding or discharge that is dark-colored and grainy. This may last for a few days. The discharge may be caused by a liquid (solution) that was used during the procedure. You may need to wear a sanitary pad during this time. Spotting of blood for at least 48 hours after the procedure. Follow these instructions at home: Medicines Take over-the-counter and prescription medicines only as told by your health care provider. Talk with your health care provider about what type of over-the-counter pain medicines and prescription medicines you can start to take again. It is especially important to talk with your health care provider if you take blood thinners. Activity Avoid using douche products, using tampons, and having sex for at least 3 days after the procedure or for as long as told by your health care provider. Return to your normal activities as told by your health care provider. Ask your health care provider what activities are safe for you. General instructions Ask your health care provider if you may take baths, swim, or use a hot tub. You may take showers. If you use birth control (contraception), continue to use it. Keep all follow-up visits. This is important. Contact a health care provider if: You have a fever or chills. You faint or feel  light-headed. Get help right away if: You have heavy bleeding from your vagina or pass blood clots. Heavy bleeding is bleeding that soaks through a sanitary pad in less than 1 hour. You have vaginal discharge that is abnormal, is yellow in color, or smells bad. This could be a sign of infection. You have severe pain or cramps in your lower abdomen that do not go away with medicine. Summary If you had a colposcopy without a biopsy, you can expect to feel fine right away, but you may have some spotting of blood for a few days. You can return to your normal activities. If you had a colposcopy with a biopsy, it is common to have mild pain for a few days and spotting for 48 hours after the procedure. Avoid using douche products, using tampons, and having sex for at least 3 days after the procedure or for as long as told by your health care provider. Get help right away if you have heavy bleeding, severe pain, or signs of infection. This information is not intended to replace advice given to you by your health care provider. Make sure you discuss any questions you have with your health care provider. Document Revised: 09/11/2020 Document Reviewed: 09/11/2020 Elsevier Patient Education  2024 [employer].

## 2024-01-10 DIAGNOSIS — Z23 Encounter for immunization: Secondary | ICD-10-CM | POA: Diagnosis not present

## 2024-01-10 LAB — SURGICAL PATHOLOGY

## 2024-01-13 ENCOUNTER — Ambulatory Visit: Payer: Self-pay | Admitting: Obstetrics and Gynecology

## 2024-03-09 ENCOUNTER — Other Ambulatory Visit: Payer: Self-pay | Admitting: Obstetrics and Gynecology

## 2024-03-09 DIAGNOSIS — Z1231 Encounter for screening mammogram for malignant neoplasm of breast: Secondary | ICD-10-CM

## 2024-04-08 ENCOUNTER — Ambulatory Visit
Admission: RE | Admit: 2024-04-08 | Discharge: 2024-04-08 | Disposition: A | Discharge status: Home / Self Care | Source: Ambulatory Visit | Attending: Obstetrics and Gynecology | Admitting: Obstetrics and Gynecology

## 2024-04-08 DIAGNOSIS — Z1231 Encounter for screening mammogram for malignant neoplasm of breast: Secondary | ICD-10-CM

## 2024-04-14 ENCOUNTER — Ambulatory Visit: Payer: Self-pay | Admitting: Obstetrics and Gynecology
